# Patient Record
Sex: Male | Born: 1955 | Race: White | Hispanic: No | Marital: Married | Smoking: Never smoker
Health system: Southern US, Community
[De-identification: ages and names within clinical notes are randomized; demographics above are authoritative.]

## PROBLEM LIST (undated history)

## (undated) DIAGNOSIS — I10 Essential (primary) hypertension: Secondary | ICD-10-CM

## (undated) DIAGNOSIS — K4090 Unilateral inguinal hernia, without obstruction or gangrene, not specified as recurrent: Secondary | ICD-10-CM

## (undated) DIAGNOSIS — Z973 Presence of spectacles and contact lenses: Secondary | ICD-10-CM

---

## 2007-09-20 ENCOUNTER — Inpatient Hospital Stay (HOSPITAL_COMMUNITY): Admission: EM | Admit: 2007-09-20 | Discharge: 2007-09-24 | Payer: Self-pay | Admitting: Emergency Medicine

## 2007-09-20 ENCOUNTER — Encounter (INDEPENDENT_AMBULATORY_CARE_PROVIDER_SITE_OTHER): Payer: Self-pay | Admitting: Surgery

## 2007-09-21 ENCOUNTER — Encounter: Payer: Self-pay | Admitting: Gastroenterology

## 2007-09-27 ENCOUNTER — Ambulatory Visit: Payer: Self-pay | Admitting: Gastroenterology

## 2010-04-24 IMAGING — CR DG ERCP WO/W SPHINCTEROTOMY
13 series · 13 of 13 positions shown · non-contrast
Comparison: Operative cholangiogram obtained yesterday.

CLINICAL DATA: Choledocholithiasis..

ERCP with sphincterotomy:

[dr (1 of 13)]
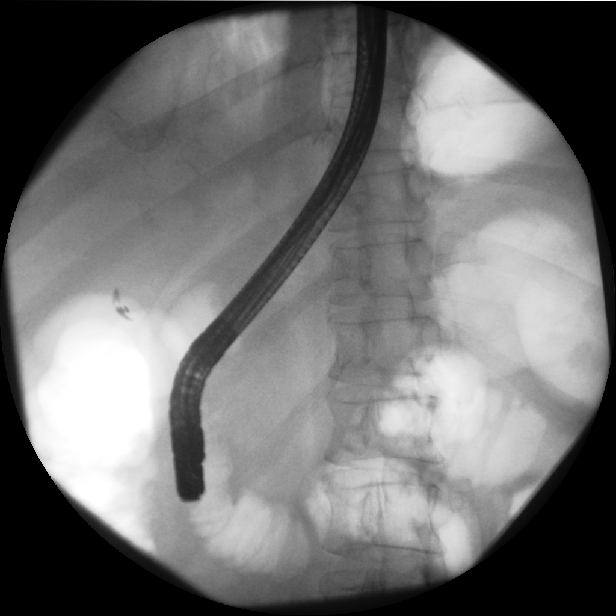

[dr (2 of 13)]
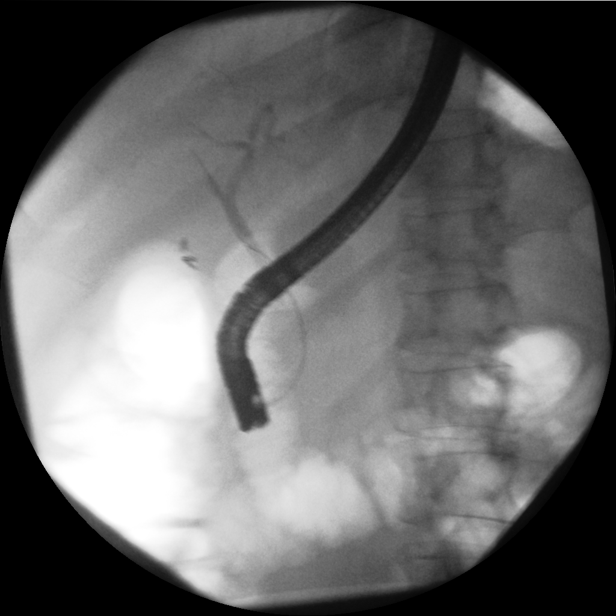

[dr (3 of 13)]
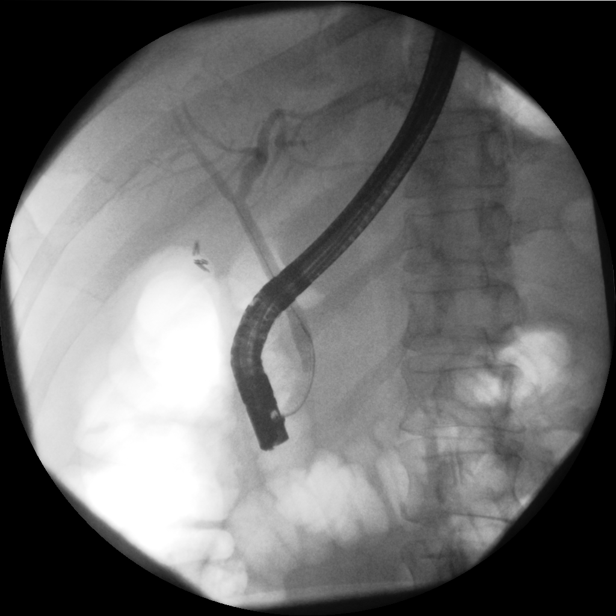

[dr (4 of 13)]
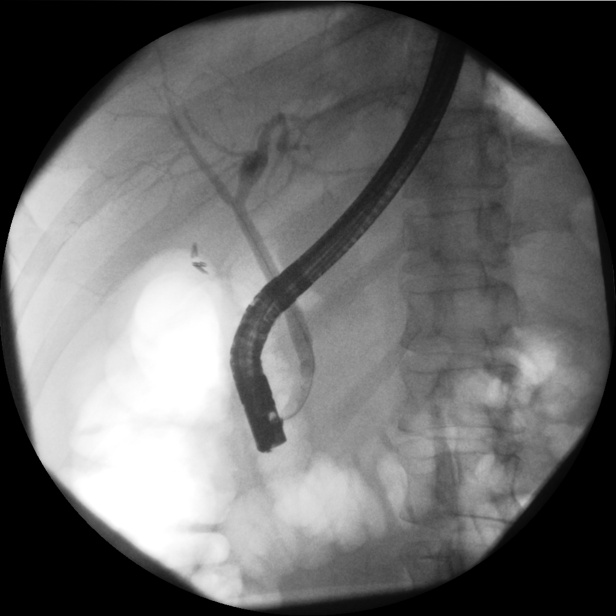

[dr (5 of 13)]
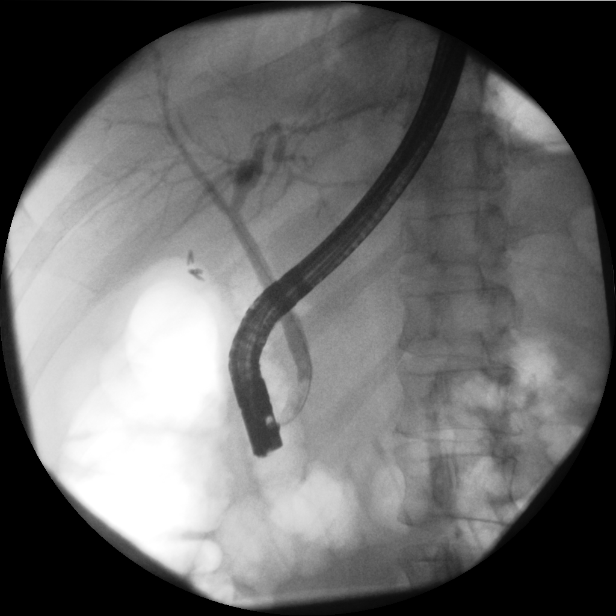

[dr (6 of 13)]
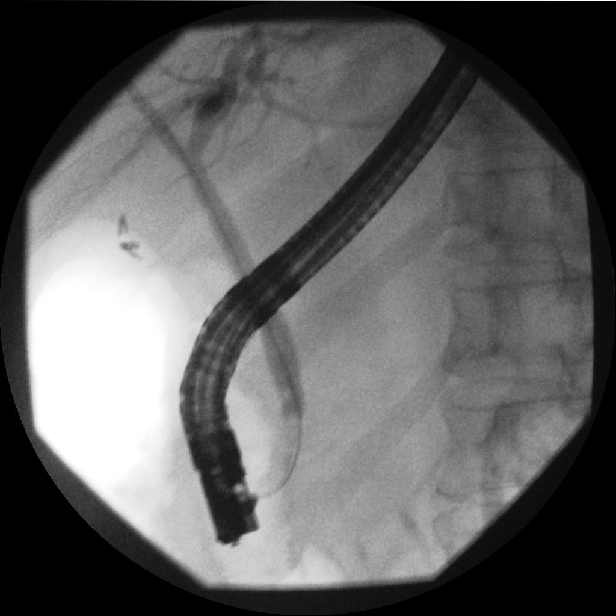

[dr (7 of 13)]
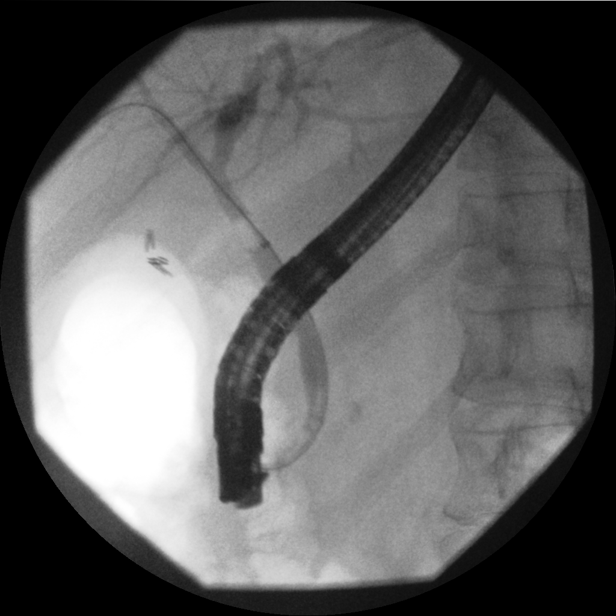

[dr (8 of 13)]
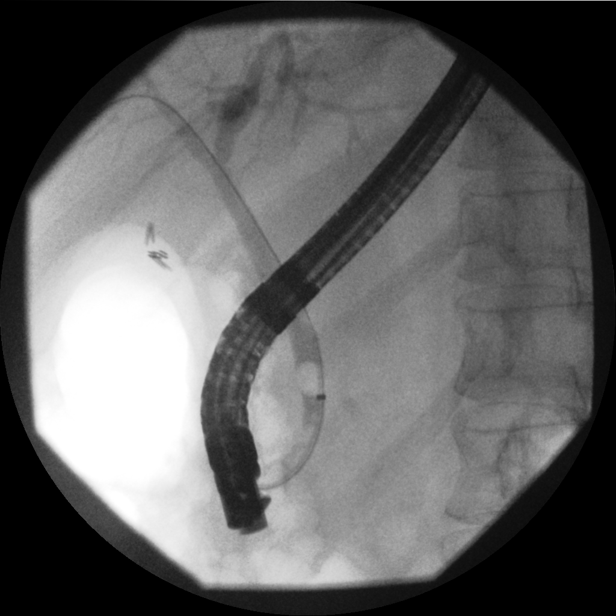

[dr (9 of 13)]
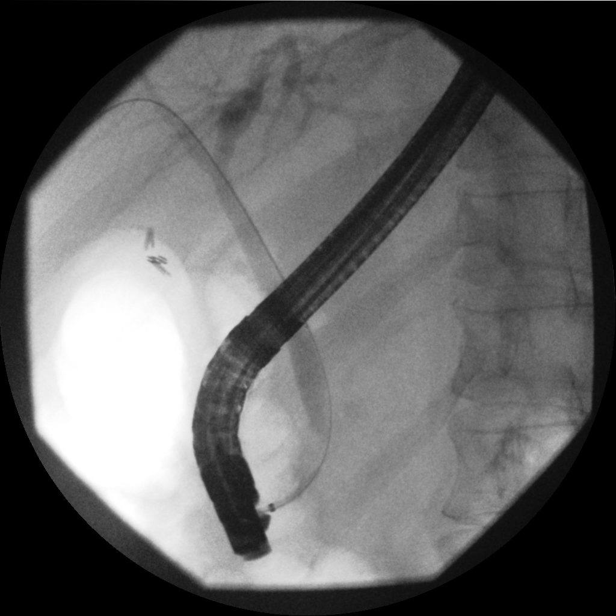

[dr (10 of 13)]
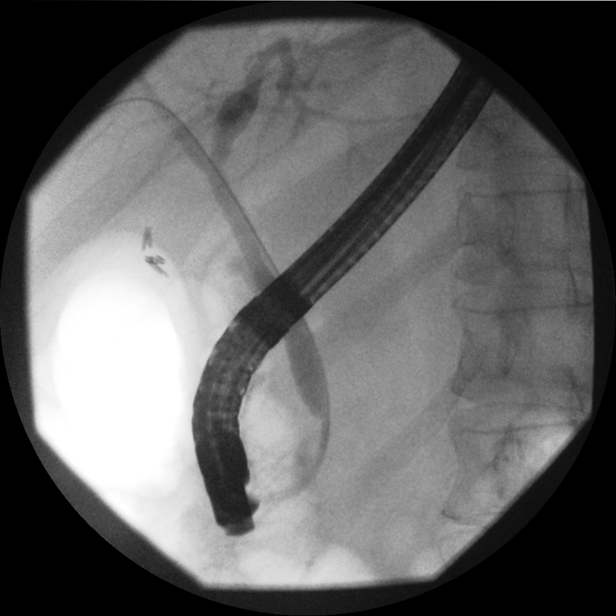

[dr (11 of 13)]
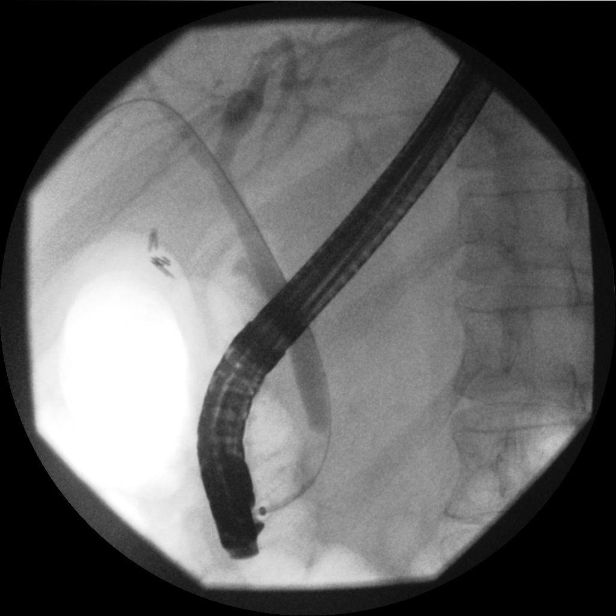

[dr (12 of 13)]
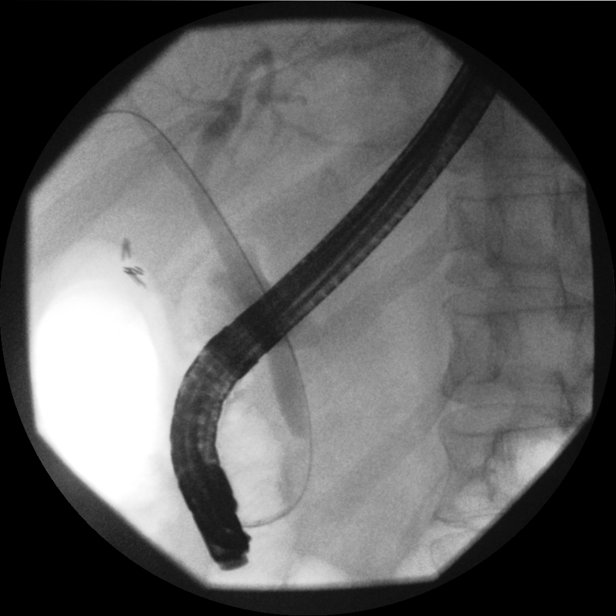

[dr (13 of 13)]
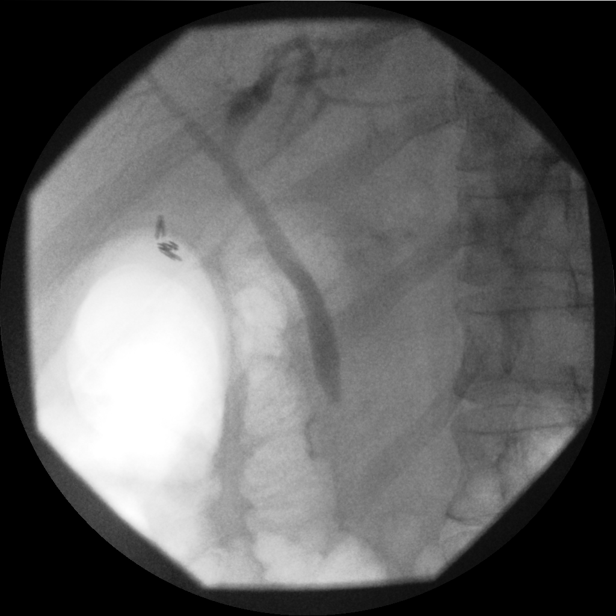

[13 of 13 positions shown; findings below may reference images not displayed]

FINDINGS: Multiple spot images of the right upper quadrant of the
abdomen demonstrate an endoscope in place with retrograde injection
of the common duct and intrahepatic ducts.  The cystic duct remnant
is also visualized.  On an image containing an inflated balloon in
the common duct, multiple filling defects are seen in the distal
common duct.  Multiple stones were reportedly removed during the
procedure, following sphincterotomy.  The last image demonstrates
removal of the scope with no residual filling defects seen.  No
flow of contrast into the duodenum is demonstrated.
IMPRESSION: Sphincterotomy and stone removal.

## 2010-10-12 NOTE — H&P (Signed)
Colin Powell, Colin Powell NO.:  [id number]   MEDICAL RECORD NO.:  [id number]          PATIENT TYPE:  INP   LOCATION:  0098                         FACILITY:  [facility]   PHYSICIAN:  Wilmon Arms. Corliss Skains, M.D. DATE OF BIRTH:  27-Dec-1955   DATE OF ADMISSION:  09/20/2007  DATE OF DISCHARGE:                              HISTORY & PHYSICAL   CHIEF COMPLAINT:  Right upper quadrant abdominal pain, nausea, and  vomiting.   PRIMARY CARE DOCTOR:  Dr. Benedetto Goad.   HISTORY OF PRESENT ILLNESS:  The patient is a 55 year old male with a  known recent history of biliary colic who presents with a 24-hour  episode of severe right upper quadrant pain.  The pain is spread across  his abdomen and is actually lower in his abdomen now.  He has had some  nausea and vomiting.  He has not had any bowel movements in the last 24  hours.  He presented to Dr. Tawana Scale office who sent him to the [facility] emergency department.  We were consulted to see the patient.   PAST MEDICAL HISTORY:  None.   PAST SURGICAL HISTORY:  None.   FAMILY HISTORY:  Noncontributory.   SOCIAL HISTORY:  Nonsmoker.  Occasional alcohol use.   ALLERGIES:  NONE.   MEDICATIONS:  None.   REVIEW OF SYSTEMS:  Otherwise unremarkable.   PHYSICAL EXAMINATION:  Vital Signs:  Temperature 98.4, pulse 101,  respirations 18, blood pressure 149/96.  General:  This is a well-  developed, well-nourished male in no apparent distress.  HEENT:  EOMI.  Sclerae are anicteric.  Neck:  No mass, no thyromegaly.  Lungs: Clear.  Normal respiratory effort.  Heart: Regular rate and rhythm.  No murmur.  Abdomen: Hypoactive bowel sounds, tender in the right upper quadrant as  well as across the midabdomen.  There is some mild radiation of pain  around to the right side of his back.  Extremities: No edema.  Well  perfused.  SKIN:  Warm and dry with no sign of jaundice.   RADIOLOGIC STUDIES:  The patient has an ultrasound from 07/20/2007  which  showed cholelithiasis with sludge but no sign of wall thickening.   LABORATORY DATA:  Labs were pending at the initial time of admission,  but postoperatively, his white count was noted be elevated at 13.7 and  hemoglobin 17.6.  Electrolytes within normal limits.  Amylase 1255,  lipase 844.  Total bilirubin 822.  AST and ALT are also elevated.   IMPRESSION:  1. Acute cholecystitis.  2. Gallstone pancreatitis.  3. Choledocholithiasis.   PLAN:  Laparoscopic cholecystectomy with intraoperative cholangiogram.  If we are unable clear the common bile duct, then he will need an ERCP.      Wilmon Arms. Tsuei, M.D.  Electronically Signed     MKT/MEDQ  D:  09/20/2007  T:  09/20/2007  Job:  [id number]

## 2010-10-12 NOTE — Op Note (Signed)
NAMEAVIRAJ, Colin Powell Colin Powell.:  [id number]   MEDICAL RECORD Colin Powell.:  [id number]          PATIENT TYPE:  INP   LOCATION:  0098                         FACILITY:  [facility]   PHYSICIAN:  Wilmon Arms. Corliss Skains, M.D. DATE OF BIRTH:  May 30, 1956   DATE OF PROCEDURE:  09/20/2007  DATE OF DISCHARGE:                               OPERATIVE REPORT   PREOPERATIVE DIAGNOSIS:  Acute cholecystitis.   POSTOPERATIVE DIAGNOSIS:  Acute cholecystitis, choledocholithiasis,  gallstone pancreatitis.   PROCEDURE PERFORMED:  Laparoscopic cholecystectomy with intraoperative  cholangiogram.   SURGEON:  Wilmon Arms. Tsuei, M.D., FACS   ANESTHESIA:  General endotracheal.   INDICATIONS:  The patient is a 55 year old male who presents with 24  hours of acute right upper quadrant pain with nausea and vomiting.  This  pain is radiating around to his back and has begun to spread across the  lower part of his abdomen.  He has had Colin Powell appetite.  He has been  afebrile.  His white count was elevated.  His bilirubin was also  elevated to 2.2.  Prior to the time of surgery, his amylase and lipase  were not back from the lab.   DESCRIPTION OF PROCEDURE:  The patient was brought to the operating room  and placed in the supine position on operating room table.  After an  adequate level of general anesthesia was obtained, the patient's abdomen  was shaved, prepped with Betadine, and draped in a sterile fashion.  A  time out was taken to assure the proper patient and proper procedure.  The area below his umbilicus was infiltrated with 0.25% Marcaine with  epinephrine.  A transverse incision was made and dissection was carried  down to the fascia.  The fascia was grasped with Kocher clamps and  incised vertically.  The peritoneal cavity was bluntly entered.  A stay  suture of 0 Vicryl was placed around the fascial opening.  The Hasson  cannula was inserted and secured with a stay suture.  Pneumoperitoneum  was  obtained by insufflating CO2 and maintaining a maximum pressure of  15 mmHg.  The laparoscope was inserted and the patient was positioned in  reversed Trendelenburg and tilted to his left.  A very inflamed  edematous gallbladder was identified.  An 11 mm port was placed in the  subxiphoid position, two 5 mm ports were placed in the right upper  quadrant.  The gallbladder was grasped with a clamp and elevated.  Cautery was used to dissect a lot of adhesions away from the surface of  the gallbladder.  We were able to retract the gallbladder superiorly.  We used mostly blunt dissection to expose the wall of the gallbladder  near the hilum.  Cautery was used very sparingly.  We were able to  bluntly dissect around a tubular vessel entering the gallbladder.  This  did not appear to be a cystic duct.  This was ligated with clips and  divided.  Examination of the stump showed that this, indeed, had a lumen  and was the cystic artery.  We then bluntly dissected around the  cystic  duct.  The cystic duct seemed a bit dilated.  There were Colin Powell stones  palpated within the cystic duct, however.  Once we had cleared an  adequate length of the cystic duct, it was ligated and clipped distally.  An opening was created on the cystic duct.  We immediately encountered  multiple tiny stones and some sludge.  A Cook cholangiogram catheter was  then inserted through a stab incision threaded into the cystic duct.  It  was secured with a clip.  A cholangiogram was obtained which showed good  flow proximally and distally of the biliary tree.  However, contrast did  not flow into the duodenum.  There seemed to be some filling defects at  the distal common bile duct.  The catheter was then removed.  The cystic  duct was ligated with three clips and divided.  A posterior branch of  the cystic artery was also ligated and divided.  Cautery was then used  to remove the gallbladder from the liver bed.  The gallbladder was   placed in an EndoCatch sac.  We thoroughly cauterized the gallbladder  fossa for hemostasis.  The gallbladder was then removed from the  umbilical port site.  We suctioned out as much irrigation as possible.  The trocars were removed as the pneumoperitoneum was released.  The  pursestring suture was used to close the fascia.  4-0 Monocryl was used  to close the skin incisions.  Steri-Strips and clean dressings were  applied.  The patient was extubated and brought to recovery in stable  condition.  All sponge, instrument, and needle counts were correct.      Wilmon Arms. Tsuei, M.D.  Electronically Signed     MKT/MEDQ  D:  09/20/2007  T:  09/20/2007  Job:  [id number]

## 2010-10-15 NOTE — Discharge Summary (Signed)
NAMEDONNEY, CARAVEO NO.:  [id number]   MEDICAL RECORD NO.:  [id number]          PATIENT TYPE:  AMB   LOCATION:  DAY                          FACILITY:  [facility]   PHYSICIAN:  Wilmon Arms. Corliss Skains, M.D. DATE OF BIRTH:  02-11-1956   DATE OF ADMISSION:  09/20/2007  DATE OF DISCHARGE:  09/24/2007                               DISCHARGE SUMMARY   ADMISSION DIAGNOSES:  Acute cholecystitis.   DISCHARGE DIAGNOSIS:  1. Acute cholecystitis.  2. Choledocholithiasis.  3. Gallstone pancreatitis.   PROCEDURE PERFORMED:  1. Laparoscopic cholecystectomy with intraoperative cholangiogram  2. Endoscopic retrograde cholangiopancreatogram.   BRIEF HISTORY:  The patient is a 55 year old male in good health who had  been diagnosed with symptomatic cholelithiasis.  He was originally  scheduled for elective cholecystectomy but presented with 24 hours of  acute right upper quadrant pain associated with nausea and vomiting.  Presented to the emergency department and was noted to have an elevated  white count and mildly elevated bilirubin of 2.2.  He was brought to the  operating room where he underwent a laparoscopic cholecystectomy with  intraoperative cholangiogram.  His gallbladder was quite inflamed.  The  cholangiogram showed good flow proximally and distally in the biliary  tree but the contrast did not flow into the duodenum.  It appeared that  he was obstructed from the distal common bile duct stones  postoperatively.  The remainder the patient's lab work was finally  complete and he was noted to have some pancreatitis.  The patient was  admitted to hospital and was seen in consultation by gastroenterology.  He underwent an ERCP on 04/24 with a sphincterotomy.  The balloon was  used to removed three stones.  The post sphincterotomy cholangiogram  showed clear common bile duct.  The patient slowly recovered and his lab  values returns to normal.  He was discharged home on  postoperative day  #4.  At that time he had not had any nausea or pain.  He was tolerating  a regular diet.   LABORATORY DATA:  Total bilirubin level was 1.3, amylase and lipase were  normal, AST was 21 and ALT was 112.   DISCHARGE INSTRUCTIONS:  1. The patient is given p.r.n. Percocet.  2. Follow up in 2-3 weeks with Dr. Corliss Skains.  3. The patient may shower.  4. He should take a low-fat diet.     Wilmon Arms. Tsuei, M.D.  Electronically Signed    MKT/MEDQ  D:  10/10/2007  T:  10/10/2007  Job:  [id number]

## 2011-02-22 LAB — CBC
HCT: 34.4 — ABNORMAL LOW
HCT: 52.4 — ABNORMAL HIGH
Hemoglobin: 12 — ABNORMAL LOW
Hemoglobin: 17.6 — ABNORMAL HIGH
MCHC: 33.5
MCHC: 34.5
MCV: 97
RBC: 4.27
RDW: 13.6
WBC: 10.2
WBC: 12.2 — ABNORMAL HIGH

## 2011-02-22 LAB — COMPREHENSIVE METABOLIC PANEL
ALT: 536 — ABNORMAL HIGH
AST: 332 — ABNORMAL HIGH
Alkaline Phosphatase: 101
Alkaline Phosphatase: 64
BUN: 14
BUN: 5 — ABNORMAL LOW
CO2: 28
Calcium: 8.2 — ABNORMAL LOW
Calcium: 9.3
Chloride: 101
Creatinine, Ser: 0.99
GFR calc Af Amer: >60
GFR calc non Af Amer: >60
Glucose, Bld: 107 — ABNORMAL HIGH
Glucose, Bld: 156 — ABNORMAL HIGH
Potassium: 3.9
Potassium: 4.3
Sodium: 142
Total Bilirubin: 1.2
Total Protein: 7.2

## 2011-02-22 LAB — HEPATIC FUNCTION PANEL
AST: 81 — ABNORMAL HIGH
Albumin: 2.7 — ABNORMAL LOW
Alkaline Phosphatase: 76
Indirect Bilirubin: 1 — ABNORMAL HIGH
Total Protein: 5.4 — ABNORMAL LOW

## 2011-02-22 LAB — AMYLASE
Amylase: 1255 — ABNORMAL HIGH
Amylase: 192 — ABNORMAL HIGH
Amylase: 600 — ABNORMAL HIGH
Amylase: 68

## 2011-02-22 LAB — APTT: aPTT: 28

## 2011-02-22 LAB — LIPASE, BLOOD
Lipase: 23
Lipase: 844 — ABNORMAL HIGH

## 2016-01-06 DIAGNOSIS — D229 Melanocytic nevi, unspecified: Secondary | ICD-10-CM | POA: Insufficient documentation

## 2016-01-06 DIAGNOSIS — F5221 Male erectile disorder: Secondary | ICD-10-CM | POA: Insufficient documentation

## 2017-08-16 DIAGNOSIS — Z1211 Encounter for screening for malignant neoplasm of colon: Secondary | ICD-10-CM | POA: Insufficient documentation

## 2018-06-05 ENCOUNTER — Ambulatory Visit: Payer: Self-pay | Admitting: Surgery

## 2018-06-05 NOTE — H&P (Signed)
Colin Powell Documented: 06/04/2018 4:46 PM Location: [facility] Patient #: [id number] DOB: May 25, 1956 Married / Language: Colin Powell / Race: White Male  History of Present Illness Colin Hector MD; 06/05/2018 8:56 AM) The patient is a 63 year old male who presents with a colorectal polyp. Note for "Colorectal polyp": ` ` ` Patient sent for surgical consultation at the request of Dr. [facility]  Chief Complaint: Abnormal lesion in rectum seen on screening colonoscopy  The patient is a pleasant male who underwent a 10 year follow-up colonoscopy June 2019. Normal except for a mass in the distal rectum noted. Suspected by his gastroenterologist, Dr. Paulita Fujita, to most likely be benign. However, surgical consultation recommended just in case. My examination a few weeks after the colonoscopy seem more consistent with a benign anal crypt polyp or atypical hemorrhoid. Recommended 6 month follow-up. He comes in without any symptoms or complaints are itching or irritation.  Patient usually has a loose bowel movement about every other day. Had a cholecystectomy about a decade ago. He's not had any anorectal issues and he knows of. Had a normal colonoscopy 10 years ago. Not a diabetic. Does not smoke. Walks a couple miles a day. No cardiac or pulmonary issues. 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 No history of condyloma or warts.  No personal nor family history of GI/colon cancer, inflammatory bowel disease, irritable bowel syndrome, allergy such as Celiac Sprue, dietary/dairy problems, colitis, ulcers nor gastritis. No recent sick contacts/gastroenteritis. No travel outside the country. No changes in diet. No dysphagia to solids or liquids. No significant heartburn or reflux. No hematochezia, hematemesis, coffee ground emesis. No evidence of prior gastric/peptic ulceration.  (Review of systems as stated in this history (HPI) or in the review  of systems. 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 Caffeine use Coffee. No drug use Tobacco use Never smoker.  Family History Colin Hector, MD; 06/04/2018 4:56 PM) Heart Disease Father, Mother. Hypertension Father.  Other Problems Colin Hector, MD; 06/04/2018 4:56 PM) Cholelithiasis    Vitals ([name] CMA; 06/04/2018 4:46 PM) 06/04/2018 4:46 PM Weight: 186 lb Height: 70in Body Surface Area: 2.02 m Body Mass Index: 26.69 kg/m  Pulse: 83 (Regular)  BP: 130/82 (Sitting, Left Arm, Standard)      Physical Exam Colin Hector MD; 06/04/2018 5:17 PM)  General Mental Status-Alert. General Appearance-Not in acute distress, Not Sickly. Orientation-Oriented X3. Hydration-Well hydrated. Voice-Normal.  Integumentary Global Assessment Normal Exam - Axillae: non-tender, no inflammation or ulceration, no drainage. and Distribution of scalp and body hair is normal. General Characteristics Temperature - normal warmth is noted.  Head and Neck Head-normocephalic, atraumatic with no lesions or palpable masses. Face Global Assessment - atraumatic, no absence of expression. Neck Global Assessment - no abnormal movements, no bruit auscultated  on the right, no bruit auscultated on  the left, no decreased range of motion, non-tender. Trachea-midline. Thyroid Gland Characteristics - non-tender.  Eye Eyeball - Left-Extraocular movements intact, No Nystagmus. Eyeball - Right-Extraocular movements intact, No Nystagmus. Cornea - Left-No Hazy. Cornea - Right-No Hazy. Sclera/Conjunctiva - Left-No scleral icterus, No Discharge. Sclera/Conjunctiva - Right-No scleral icterus, No Discharge. Pupil - Left-Direct reaction to light normal. Pupil - Right-Direct reaction to light normal.  ENMT Ears Pinna - Left - no drainage observed, no generalized tenderness observed. Right - no drainage observed, no generalized tenderness observed. Nose and Sinuses Nose - no destructive lesion observed. Nares - Left - quiet respiration. Right - quiet respiration. Mouth and Throat Lips - Upper Lip - no fissures observed, no pallor noted. Lower Lip - no fissures observed, no pallor noted. Nasopharynx - no discharge present. Oral Cavity/Oropharynx - Tongue - no dryness observed. Oral Mucosa - no cyanosis observed. Hypopharynx - no evidence of airway distress observed.  Chest and Lung Exam Inspection Movements - Normal and Symmetrical. Accessory muscles - No use of accessory muscles in breathing. Palpation Normal exam - Non-tender. Auscultation Breath sounds - Normal and Clear.  Cardiovascular Auscultation Rhythm - Regular. Murmurs & Other Heart Sounds - Normal exam - No Murmurs and No Systolic Clicks.  Abdomen Inspection Normal Exam - No Visible peristalsis and No Abnormal pulsations. Umbilicus - No Bleeding, No Urine drainage. Palpation/Percussion Normal exam - Soft, Non Tender, No Rebound tenderness, No Rigidity (guarding) and No Cutaneous hyperesthesia. Note: Abdomen soft. Mild diastases recti. Small 4 mm umbilical hernia at stalk asymptomatic. Nontender. Not distended. No other obvious abdominal wall hernias. No  guarding.  Male Genitourinary Sexual Maturity Tanner 5 - Adult hair pattern and Adult penile size and shape. Note: Obvious left inguinal hernia more prominent with cough. No obvious right inguinal hernia but mild impulse suspected with Valsalva. Normal external genitalia. Epididymi, testes, and spermatic cords normal without any masses.  Rectal Note: Please refer to anoscopy section. Right anterior pedunculated mass at anal crypt consistent with hemorrhoid/benign anal canal polyp. Smooth. Not ulcerated. Not fibrotic. Grade 2 internal hemorrhoids.  Peripheral Vascular Upper Extremity Inspection - Left - No Cyanotic nailbeds, Not Ischemic. Right - No Cyanotic nailbeds, Not Ischemic.  Neurologic Neurologic evaluation reveals -normal attention span and ability to concentrate, able to name objects and repeat phrases. Appropriate fund of knowledge , normal sensation and normal coordination. Mental Status Affect - not angry, not paranoid. Cranial Nerves-Normal Bilaterally. Gait-Normal.  Neuropsychiatric Mental status exam performed with findings of-able to articulate well with normal speech/language, rate, volume and coherence, thought content normal with ability to perform basic computations and apply abstract reasoning and no evidence of hallucinations, delusions, obsessions or homicidal/suicidal ideation.  Musculoskeletal Global Assessment Spine, Ribs and Pelvis - no instability, subluxation or laxity. Right Upper Extremity - no instability, subluxation or laxity.  Lymphatic Head & Neck General Head & Neck Lymphatics: Bilateral - Description - No Localized lymphadenopathy. Axillary General Axillary Region: Bilateral - Description - No Localized lymphadenopathy. Femoral & Inguinal Generalized Femoral & Inguinal Lymphatics: Left: Right - Description - No Localized lymphadenopathy. Description - No Localized lymphadenopathy.   Results Colin Hector MD; 06/05/2018 8:55  AM) Procedures  Name Value Date Hemorrhoids Procedure Other: Again noted in the right anterior aspect is a enlarged anal crypt polyp. 15x4x22mm. Very smooth tip without any ulceration. Pink with no color changes. Essentially unchanged............Marland KitchenSome mild skin changes stable and unchanged externally. No external hemorrhoids. No abscess or fissure. Normal sphincter tone. No other rectal masses felt or seen  to 7 cm by digital exam and anoscopy  Performed: 06/04/2018 5:04 PM    Assessment & Plan Colin Hector MD; 06/05/2018 8:55 AM)  LEFT INGUINAL HERNIA (K40.90) Impression: More obvious left inguinal hernia. I suspect given how relatively young and active he is, he would benefit from surgery to repair this. We do outpatient elective laparoscopic repair. Check the contralateral side to make sure he is not developing hernia there as well. He was open to the idea but I don't think he is ready make a decision about this. He'll let us know  Current Plans Written instructions provided The anatomy & physiology of the abdominal wall and pelvic floor was discussed. The pathophysiology of hernias in the inguinal and pelvic region was discussed. Natural history risks such as progressive enlargement, pain, incarceration, and strangulation was discussed. Contributors to complications such as smoking, obesity, diabetes, prior surgery, etc were discussed.  I feel the risks of no intervention will lead to serious problems that outweigh the operative risks; therefore, I recommended surgery to reduce and repair the hernia. I explained laparoscopic techniques with possible need for an open approach. I noted usual use of mesh to patch and/or buttress hernia repair  Risks such as bleeding, infection, abscess, need for further treatment, heart attack, death, and other risks were discussed. I noted a good likelihood this will help address the problem. Goals of post-operative recovery were  discussed as well. Possibility that this will not correct all symptoms was explained. I stressed the importance of low-impact activity, aggressive pain control, avoiding constipation, & not pushing through pain to minimize risk of post-operative chronic pain or injury. Possibility of reherniation was discussed. We will work to minimize complications.  An educational handout further explaining the pathology & treatment options was given as well. Questions were answered. The patient expresses understanding & wishes to proceed with surgery.   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 Not super high suspicion. Stable for the past 6 months and asymptomatic. Reassuring against any major concern. I think he can follow-up as needed. Should he have symptoms of bleeding or pruritus or irritation, can reconsider removal. He feels reassured  Current Plans ANOSCOPY, DIAGNOSTIC (15176) Return to clinic as needed.  Soreness, decreased appetite, and poor energy level are common problems after surgery. While many people can struggle with a bad day, these concerns should gradually fade away or at least improve. Much of your recovery depends on your health & the severity of your operation. Please call if you have any further questions / concerns related to surgery.  Increase activity as tolerated to regular everyday activity. Consider daily low impact exercise every day such as walking an hour a day.  Do not push through pain. If it hurts to do it, then don't do it.  Diet as tolerated. Low fat high fiber diet ideal. 30 g fiber a day ideal. Consider taking a daily fiber supplement to keep your bowels regular.  Followup with your primary care physician for other health issues as would normally be done.  Consider screening for malignancies (breast, prostate, colon, melanoma, etc) as appropriate. Discuss with  you primary care physician.   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 Not severe. Consider adding fiber to help thicken up his stools but his bowel movements are not highly irregular. He feels reassured.   PREOP - ING HERNIA - ENCOUNTER FOR PREOPERATIVE EXAMINATION FOR GENERAL SURGICAL  PROCEDURE (Z01.818)  Current Plans You are being scheduled for surgery- Our schedulers will call you.  You should hear from our office's scheduling department within 5 working days about the location, date, and time of surgery. We try to make accommodations for patient's preferences in scheduling surgery, but sometimes the OR schedule or the surgeon's schedule prevents [place] from making those accommodations.  If you have not heard from our office [phone]) in 5 working days, call the office and ask for your surgeon's nurse.  If you have other questions about your diagnosis, plan, or surgery, call the office and ask for your surgeon's nurse.  Pt Education - Pamphlet Given - Laparoscopic Hernia Repair: discussed with patient and provided information. Pt Education - [facility] Hernia Post-Op HCI ([employer]): discussed with patient and provided information. Pt Education - [facility] Mesh education: discussed with patient and provided information.  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 Some mild intermittent flares a couple times a year usually treated with Valtrex. No perianal complaints.  Colin Hector, MD, FACS, MASCRS [facility]astro[facility]ally [facility] Surgery    [address], [place], [place] [address] [phone] Main / Paging [phone] Fax

## 2019-03-31 DIAGNOSIS — U071 COVID-19: Secondary | ICD-10-CM

## 2019-03-31 HISTORY — DX: COVID-19: U07.1

## 2019-11-05 DIAGNOSIS — B009 Herpesviral infection, unspecified: Secondary | ICD-10-CM | POA: Insufficient documentation

## 2019-11-05 DIAGNOSIS — K4020 Bilateral inguinal hernia, without obstruction or gangrene, not specified as recurrent: Secondary | ICD-10-CM | POA: Diagnosis present

## 2019-11-05 DIAGNOSIS — I10 Essential (primary) hypertension: Secondary | ICD-10-CM | POA: Insufficient documentation

## 2019-12-31 DIAGNOSIS — L03031 Cellulitis of right toe: Secondary | ICD-10-CM | POA: Insufficient documentation

## 2020-01-21 ENCOUNTER — Ambulatory Visit: Payer: Self-pay | Admitting: Surgery

## 2020-01-21 NOTE — H&P (Signed)
Phillips Colin Powell Documented: 08/20/2019 9:58 AM Location: [facility] Patient #: [id number] DOB: 1956/03/08 Married / Language: Cleophus Molt / Race: White Male   History of Present Illness [name] MD; 08/20/2019 12:46 PM) The patient is a 64 year old male who presents with a colorectal polyp. Note for "Colorectal polyp": ` ` ` Patient sent for surgical consultation at the request of Dr. [facility]  Chief Complaint: Abnormal lesion in rectum seen on screening colonoscopy   Patient returns from last year. Had a pedunculated anal canal mass consistent with an anal crypt hypertrophic polyp. Seemed benign. No change between colonoscopy 2019 & my office anoscopy 2020. Tried to reassure him. He was asymptomatic and did not want to have it removed. He also had some vague groin discomfort and noted he had an inguinal hernia. Offered hernia repair. He wished to hold off.  Since that time he noted he had some anal "twinges". Persistent for the past few months. Does not notice any obvious bleeding. No leaking of stool. However some discomfort or fullness like he hasn't fully emptied after defecation. Usually moves his bowels once or twice a day. Occasionally they are loose but nothing too severe. He is convinced it's been an issue since his cholecystectomy 2019. No major swings of diarrhea or constipation. He also notes that he felt sharp groin pain that made him feel like he was "kicked in the knots". It persisted for several weeks. Less sensitive now.  Based on these symptoms, he wished to return to double check nothing was going on that was of concern. No problems with urination or urinary tract infections. No recent fall or trauma. No other surgeries. No new health issues.  This patient encounter took 35 minutes today to perform the following: obtain history, perform exam, review outside records, interpret tests & imaging, counsel the patient  on their diagnosis; and, document this encounter, including findings & plan in the electronic health record (EHR).     PRIOR NOTE: The patient is a pleasant male who underwent a 10 year follow-up colonoscopy June 2019. Normal except for a mass in the distal rectum noted. Suspected by his gastroenterologist, Dr. Paulita Fujita, to most likely be benign. However, surgical consultation recommended just in case. My examination a few weeks after the colonoscopy seem more consistent with a benign anal crypt polyp or atypical hemorrhoid. Recommended 6 month follow-up. He comes in without any symptoms or complaints are itching or irritation.  Patient usually has a loose bowel movement about every other day. Had a cholecystectomy about a decade ago. He's not had any anorectal issues and he knows of. Had a normal colonoscopy 10 years ago. Not a diabetic. Does not smoke. Walks a couple miles a day. No cardiac or pulmonary issues. 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 No history of condyloma or warts.  No personal nor family history of GI/colon cancer, inflammatory bowel disease, irritable bowel syndrome, allergy such as Celiac Sprue, dietary/dairy problems, colitis, ulcers nor gastritis. No recent sick contacts/gastroenteritis. No travel outside the country. No changes in diet. No dysphagia to solids or liquids. No significant heartburn or reflux. No hematochezia, hematemesis, coffee ground emesis. No evidence of prior gastric/peptic ulceration.  (Review of systems as stated in this history (HPI) or in the review of systems. Otherwise all other 12 point ROS are negative) ` ` `   Past Surgical History [name], MD; 08/20/2019 10:06 AM) Gallbladder Surgery - Laparoscopic  Allergies ([name], CMA; 08/20/2019 10:00 AM) No Known Drug Allergies  [11/22/2017]: Allergies Reconciled   Medication History ([name], CMA; 08/20/2019 10:00 AM) Losartan Potassium  (50MG  Tablet, Oral) Active. Medications Reconciled    Review of Systems ([name] CMA; 08/20/2019 9:59 AM) General Not Present- Appetite Loss, Chills, Fatigue, Fever, Night Sweats, Weight Gain and Weight Loss. Skin Not Present- Change in Wart/Mole, Dryness, Hives, Jaundice, New Lesions, Non-Healing Wounds, Rash and Ulcer. HEENT Not Present- Earache, Hearing Loss, Hoarseness, Nose Bleed, Oral Ulcers, Ringing in the Ears, Seasonal Allergies, Sinus Pain, Sore Throat, Visual Disturbances, Wears glasses/contact lenses and Yellow Eyes. Respiratory Not Present- Bloody sputum, Chronic Cough, Difficulty Breathing, Snoring and Wheezing. Breast Not Present- Breast Mass, Breast Pain, Nipple Discharge and Skin Changes. Cardiovascular Not Present- Chest Pain, Difficulty Breathing Lying Down, Leg Cramps, Palpitations, Rapid Heart Rate, Shortness of Breath and Swelling of Extremities. Gastrointestinal Not Present- Abdominal Pain, Bloating, Bloody Stool, Change in Bowel Habits, Chronic diarrhea, Constipation, Difficulty Swallowing, Excessive gas, Gets full quickly at meals, Hemorrhoids, Indigestion, Nausea, Rectal Pain and Vomiting. Male Genitourinary Not Present- Blood in Urine, Change in Urinary Stream, Frequency, Impotence, Nocturia, Painful Urination, Urgency and Urine Leakage. Musculoskeletal Not Present- Back Pain, Joint Pain, Joint Stiffness, Muscle Pain, Muscle Weakness and Swelling of Extremities. Neurological Not Present- Decreased Memory, Fainting, Headaches, Numbness, Seizures, Tingling, Tremor, Trouble walking and Weakness. Psychiatric Not Present- Anxiety, Bipolar, Change in Sleep Pattern, Depression, Fearful and Frequent crying. Endocrine Not Present- Cold Intolerance, Excessive Hunger, Hair Changes, Heat Intolerance, Hot flashes and New Diabetes. Hematology Not Present- Blood Thinners, Easy Bruising, Excessive bleeding, Gland problems, HIV and Persistent Infections.  Vitals ([name]  CMA; 08/20/2019 10:01 AM) 08/20/2019 10:01 AM Weight: 180.2 lb Height: 70in Body Surface Area: 2 m Body Mass Index: 25.86 kg/m  Temp.: 78F (Oral)  Pulse: 95 (Regular)  BP: 132/86(Sitting, Left Arm, Standard)       Physical Exam [name], MD; 08/20/2019 10:7 AM) General Mental Status-Alert. General Appearance-Not in acute distress, Not Sickly. Orientation-Oriented X3. Hydration-Well hydrated. Voice-Normal.  Integumentary Global Assessment Upon inspection and palpation of skin surfaces of the - Axillae: non-tender, no inflammation or ulceration, no drainage. and Distribution of scalp and body hair is normal. General Characteristics Temperature - normal warmth is noted.  Head and Neck Head-normocephalic, atraumatic with no lesions or palpable masses. Face Global Assessment - atraumatic, no absence of expression. Neck Global Assessment - no abnormal movements, no bruit auscultated on the right, no bruit auscultated on the left, no decreased range of motion, non-tender. Trachea-midline. Thyroid Gland Characteristics - non-tender.  Eye Eyeball - Left-Extraocular movements intact, No Nystagmus - Left. Eyeball - Right-Extraocular movements intact, No Nystagmus - Right. Cornea - Left-No Hazy - Left. Cornea - Right-No Hazy - Right. Sclera/Conjunctiva - Left-No scleral icterus, No Discharge - Left. Sclera/Conjunctiva - Right-No scleral icterus, No Discharge - Right. Pupil - Left-Direct reaction to light normal. Pupil - Right-Direct reaction to light normal.  ENMT Ears Pinna - Left - no drainage observed, no generalized tenderness observed. Pinna - Right - no drainage observed, no generalized tenderness observed. Nose and Sinuses External Inspection of the Nose - no destructive lesion observed. Inspection of the nares - Left - quiet respiration. Inspection of the nares - Right - quiet respiration. Mouth and Throat Lips -  Upper Lip - no fissures observed, no pallor noted. Lower Lip - no fissures observed, no pallor noted. Nasopharynx - no discharge present. Oral Cavity/Oropharynx - Tongue - no dryness observed. Oral  Mucosa - no cyanosis observed. Hypopharynx - no evidence of airway distress observed.  Chest and Lung Exam Inspection Movements - Normal and Symmetrical. Accessory muscles - No use of accessory muscles in breathing. Palpation Palpation of the chest reveals - Non-tender. Auscultation Breath sounds - Normal and Clear.  Cardiovascular Auscultation Rhythm - Regular. Murmurs & Other Heart Sounds - Auscultation of the heart reveals - No Murmurs and No Systolic Clicks.  Abdomen Inspection Inspection of the abdomen reveals - No Visible peristalsis and No Abnormal pulsations. Umbilicus - No Bleeding, No Urine drainage. Palpation/Percussion Palpation and Percussion of the abdomen reveal - Soft, Non Tender, No Rebound tenderness, No Rigidity (guarding) and No Cutaneous hyperesthesia. Note: Abdomen soft. Mild diastases recti. Small 4 mm umbilical hernia at stalk asymptomatic. Nontender. Not distended. No other obvious abdominal wall hernias. No guarding.   Male Genitourinary Sexual Maturity Tanner 5 - Adult hair pattern and Adult penile size and shape. Note: Obvious left inguinal hernia more prominent with cough. No obvious right inguinal hernia but mild impulse suspected with Valsalva. Normal external genitalia. Epididymi, testes, and spermatic cords normal without any masses.   Rectal Note: Please refer to anoscopy section. Right anterior pedunculated mass at anal crypt consistent with hemorrhoid/benign anal canal polyp. Smooth. Not ulcerated. Not fibrotic. Grade 2 internal hemorrhoids.   Peripheral Vascular Upper Extremity Inspection - Left - No Cyanotic nailbeds - Left, Not Ischemic. Inspection - Right - No Cyanotic nailbeds - Right, Not Ischemic.  Neurologic Neurologic  evaluation reveals -normal attention span and ability to concentrate, able to name objects and repeat phrases. Appropriate fund of knowledge , normal sensation and normal coordination. Mental Status Affect - not angry, not paranoid. Cranial Nerves-Normal Bilaterally. Gait-Normal.  Neuropsychiatric Mental status exam performed with findings of-able to articulate well with normal speech/language, rate, volume and coherence, thought content normal with ability to perform basic computations and apply abstract reasoning and no evidence of hallucinations, delusions, obsessions or homicidal/suicidal ideation.  Musculoskeletal Global Assessment Spine, Ribs and Pelvis - no instability, subluxation or laxity. Right Upper Extremity - no instability, subluxation or laxity.  Lymphatic Head & Neck General Head & Neck Lymphatics: Bilateral - Description - No Localized lymphadenopathy. Axillary General Axillary Region: Bilateral - Description - No Localized lymphadenopathy. Femoral & Inguinal Generalized Femoral & Inguinal Lymphatics: Left - Description - No Localized lymphadenopathy. Right - Description - No Localized lymphadenopathy.   Results [name] MD; 08/20/2019 10:37 AM) Procedures  Name Value Date Hemorrhoids Procedure Internal exam: Mass Other: Again noted in the right anterior aspect is a enlarged anal crypt polyp. 15x4x47mm. Very smooth tip without any ulceration. Pink with no color changes. It does prolapse down into the anal canal does not all the way out. Grade 2 left lateral and right posterior hemorrhoids. Not particularly inflamed. They do not prolapse out. The polyp might be slightly longer but otherwise unchanged............Marland KitchenSome mild hypopigmented dry skin changes stable and unchanged externally. Looks like history of chronic pruritus but nothing really active at this. No external hemorrhoids. No abscess or fissure. Normal sphincter tone. No  other rectal masses felt or seen to 7 cm by digital exam and anoscopy  Performed: 08/20/2019 10:17 AM    Assessment & Plan [name] MD; 08/20/2019 10:36 AM) ANAL POLYP (K62.0) Impression: Pedunculated mass at anal canal consistent with hypertrophic anal crypt non-adenomatous polyp. Perhaps a little bit longer and prolapsing into the anal canal but not all the way (some evidence old pruritus makes me wonder if it  does but he denies). Suspect that the source of his intermittent "anal twinges". Because of it having symptoms, I again offered removal. Outpatient surgery. That would allow me do some hemorrhoidal ligation/pexy as well. He is leaning towards removal, but he wants to wait though. He notes he is probably retiring later this year and wants to wait until after that if possible. I asked him to call us to let us know Current Plans You are being scheduled for surgery- Our schedulers will call you.  You should hear from our office's scheduling department within 5 working days about the location, date, and time of surgery. We try to make accommodations for patient's preferences in scheduling surgery, but sometimes the OR schedule or the surgeon's schedule prevents [place] from making those accommodations.  If you have not heard from our office [phone]) in 5 working days, call the office and ask for your surgeon's nurse.  If you have other questions about your diagnosis, plan, or surgery, call the office and ask for your surgeon's nurse.  Follow up if no improvement or if symptoms worsen ANOSCOPY, DIAGNOSTIC (46600) PROLAPSED INTERNAL HEMORRHOIDS, GRADE 2 (K64.1) Impression: Mildly inflamed internal hemorrhoids. Not severe. Again, consider adding fiber to help thicken up his stools but his bowel movements are not highly irregular. He feels reassured. Current Plans Pt Education - [facility] Hemorrhoids (Kerigan Narvaez): discussed with patient and provided information. PRURITUS ANI  (L29.0) Impression: Some thin changes and dryness suspicious for chronic pruritus in the past. I wonder if this polyp is intermittently prolapsing else causing itching and irritation. That is usually the most likely source, especially since he has no evidence of any fistula or any other potential source of chronic perineal/perianal moisture. No active pruritus at this time. We'll give him info to help him keep an eye on things Current Plans Pt Education - [facility] Pruritus Ani (AT) LEFT INGUINAL HERNIA (K40.90) Impression: More obvious left inguinal hernia. I suspect that is what made him feel "like having kicked in the nuts" for 2 weeks. Mild impulse on right side but certainly no major hernia.  Given how relatively young and active he is, he would benefit from surgery to repair this. I again offered outpatient elective laparoscopic repair. Check the contralateral side to make sure he is not developing hernia there as well. I noted I would not do this at the same time as the anal polyp removal. I do not want increased risks of infection. Most likely would do the anorectal surgery first and then this unless he becomes symptomatic. He again wants to wait until after he is retired and will let us know. Current Plans You are being scheduled for surgery- Our schedulers will call you.  You should hear from our office's scheduling department within 5 working days about the location, date, and time of surgery. We try to make accommodations for patient's preferences in scheduling surgery, but sometimes the OR schedule or the surgeon's schedule prevents [place] from making those accommodations.  If you have not heard from our office [phone]) in 5 working days, call the office and ask for your surgeon's nurse.  If you have other questions about your diagnosis, plan, or surgery, call the office and ask for your surgeon's nurse.  The anatomy & physiology of the abdominal wall and pelvic floor was discussed. The  pathophysiology of hernias in the inguinal and pelvic region was discussed. Natural history risks such as progressive enlargement, pain, incarceration, and strangulation was discussed. Contributors to complications such as smoking,  obesity, diabetes, prior surgery, etc were discussed.  I feel the risks of no intervention will lead to serious problems that outweigh the operative risks; therefore, I recommended surgery to reduce and repair the hernia. I explained laparoscopic techniques with possible need for an open approach. I noted usual use of mesh to patch and/or buttress hernia repair  Risks such as bleeding, infection, abscess, need for further treatment, heart attack, death, and other risks were discussed. I noted a good likelihood this will help address the problem. Goals of post-operative recovery were discussed as well. Possibility that this will not correct all symptoms was explained. I stressed the importance of low-impact activity, aggressive pain control, avoiding constipation, & not pushing through pain to minimize risk of post-operative chronic pain or injury. Possibility of reherniation was discussed. We will work to minimize complications.  An educational handout further explaining the pathology & treatment options was given as well. Questions were answered. The patient expresses understanding & wishes to proceed with surgery.    Signed electronically by [name], MD (08/20/2019 12:46 PM)   Home patient now ready to consider surgery  Prolapsing anal canal polyp with pruritus.  Symptomatic hemorrhoids   [name], MD, FACS, MASCRS Gastrointestinal and Minimally Invasive Surgery  [facility] [address] [place], [place] [address] [phone] Fax [phone] Main/Paging  CONTACT INFORMATION: Weekday (9AM-5PM) concerns: Call [facility] main office at [phone] Weeknight (5PM-9AM) or Weekend/Holiday concerns: Check  [email or web address] for General Surgery [facility] coverage (Please, do not use SecureChat as it is not reliable communication to operating surgeons for immediate patient care)

## 2020-02-19 ENCOUNTER — Ambulatory Visit: Payer: Medicare HMO | Admitting: Podiatry

## 2020-02-26 ENCOUNTER — Other Ambulatory Visit: Payer: Self-pay

## 2020-02-26 ENCOUNTER — Ambulatory Visit (INDEPENDENT_AMBULATORY_CARE_PROVIDER_SITE_OTHER): Payer: Medicare HMO | Admitting: Podiatry

## 2020-02-26 ENCOUNTER — Encounter: Payer: Self-pay | Admitting: Podiatry

## 2020-02-26 ENCOUNTER — Ambulatory Visit (INDEPENDENT_AMBULATORY_CARE_PROVIDER_SITE_OTHER): Payer: Medicare HMO

## 2020-02-26 DIAGNOSIS — D169 Benign neoplasm of bone and articular cartilage, unspecified: Secondary | ICD-10-CM | POA: Diagnosis not present

## 2020-02-26 DIAGNOSIS — M79671 Pain in right foot: Secondary | ICD-10-CM | POA: Diagnosis not present

## 2020-02-26 DIAGNOSIS — M2041 Other hammer toe(s) (acquired), right foot: Secondary | ICD-10-CM

## 2020-02-26 DIAGNOSIS — M79672 Pain in left foot: Secondary | ICD-10-CM

## 2020-02-26 NOTE — Progress Notes (Signed)
Subjective:   Patient ID: Colin Powell, male   DOB: 64 y.o.   MRN: [id number]   HPI Patient presents stating is had a lot of problems with the second toe on his right foot and has problems with the fifth toe of his left foot.  States that he was very active and it got red and it blistered and then the toe became swollen and he was on antibiotics which is improved the condition.  Patient does not smoke likes to be active   Review of Systems  All other systems reviewed and are negative.       Objective:  Physical Exam Vitals and nursing note reviewed.  Constitutional:      Appearance: He is well-developed.  Pulmonary:     Effort: Pulmonary effort is normal.  Musculoskeletal:        General: Normal range of motion.  Skin:    General: Skin is warm.  Neurological:     Mental Status: He is alert.     Neurovascular status is found to be intact muscle strength is found to be adequate range of motion adequate.  Patient has an elevated second digit right proximal joint with distal arthritis around the joint surface that is painful when pressed and has a keratotic lesion inside fifth digit left that is painful.  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  Reviewed her x-rays explaining to him the surgery that would be necessary and what it would require to fix this problem  X-rays indicate there is significant elevation second digit right and rotated fifth digit left with distal medial exostotic lesion

## 2020-04-09 ENCOUNTER — Other Ambulatory Visit: Payer: Self-pay | Admitting: Surgery

## 2020-06-03 ENCOUNTER — Ambulatory Visit: Payer: Self-pay | Admitting: Surgery

## 2020-06-03 NOTE — H&P (Signed)
Nicklaus P Anspaugh Appointment: 06/03/2020 1:45 PM Location: [facility] Patient #: [id number] DOB: 01/14/1956 Married / Language: English / Race: White Male  History of Present Illness (Grenda Lora C. Mckell Riecke MD; 06/03/2020 2:42 PM) The patient is a 65 year old male who presents with hemorrhoids. Note for "Hemorrhoids": ` ` ` The patient returns s/p hemorrhoidal ligation and pexy with excision of hemorrhoids and anal mass 04/09/2020      The patient returns to clinic after surgery, gradually improving. Denies any pain. Having regular bowel movements. No fevers or chills. Does feel like he is a little looser back there. Flatus sounds at a lower octave. However no incontinence.  Notes he still has his left groin hernia. Wondered if he could consider surgery to fix that as we discussed last year. No problems with urination or defecation. Staying on a fiber supplement. No fevers or chills.   ###########################################  Pathology: Hemorrhoids 3. 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 Maury Bamba, MD; 06/03/2020 2:24 PM) Gallbladder Surgery - Laparoscopic [09/20/2007]: Lap chole w IOC, Dr Tsuei  Diagnostic Studies History (Tayvian Holycross C. Laszlo Ellerby, MD; 06/03/2020 2:24 PM) Colonoscopy within last year  Allergies (Chanel Nolan, CMA; 06/03/2020 2:02 PM) No Known Drug Allergies [11/22/2017]: Allergies Reconciled  Medication History (Chanel Nolan, CMA; 06/03/2020 2:02 PM) Losartan Potassium (50MG Tablet,  Oral) Active. Medications Reconciled  Social History (Boss Danielsen C. Coley Kulikowski, MD; 06/03/2020 2:24 PM) Alcohol use Moderate alcohol use. Caffeine use Coffee. No drug use Tobacco use Never smoker.  Family History (Haru Anspaugh C. Pierce Biagini, MD; 06/03/2020 2:24 PM) Heart Disease Father, Mother. Hypertension Father.  Other Problems (Mandalyn Pasqua C. Hallis Meditz, MD; 06/03/2020 2:24 PM) Cholelithiasis    Vitals (Chanel Nolan CMA; 06/03/2020 2:02 PM) 06/03/2020 2:02 PM Weight: 184.13 lb Height: 70in Body Surface Area: 2.02 m Body Mass Index: 26.42 kg/m  Temp.: 97.2F  Pulse: 103 (Regular)  BP: 124/80(Sitting, Left Arm, Standard)        Physical Exam (Sophiarose Eades C. Zia Kanner MD; 06/03/2020 2:42 PM)  General Mental Status-Alert. General Appearance-Not in acute distress. Voice-Normal. Note: Not toxics or sickly. Sitting & moving normally.  Integumentary Global Assessment Upon inspection and palpation of skin surfaces of the - Distribution of scalp and body hair is normal. General Characteristics Overall examination of the patient's skin reveals - no rashes and no suspicious lesions.  Head and Neck Head-normocephalic, atraumatic with no lesions or palpable masses. Face Global Assessment - atraumatic, no absence of expression. Neck Global Assessment - no abnormal movements, no decreased range of motion. Trachea-midline. Thyroid Gland Characteristics - non-tender.  Eye Eyeball - Left-Extraocular movements intact, No Nystagmus - Left. Eyeball - Right-Extraocular movements intact, No Nystagmus - Right. Upper Eyelid - Left-No Cyanotic - Left. Upper Eyelid - Right-No Cyanotic - Right. Note: Wears glasses. Vision corrected  Chest and Lung Exam Inspection Accessory muscles - No use of accessory muscles in breathing.  Male Genitourinary Note: Left inguinal hernia and groin reducible. Mildly sensitive. Mild impulse on right side with cough suspicious for  small right inguinal hernia as well. Otherwise normal external male genitalia  Rectal Note: Perianal skin clear. Mild changes but no pruritus.  Intact sphincter tone. No   external wounds.  Peripheral Vascular Upper Extremity Inspection - Left - Not Gangrenous, No Petechiae. Inspection - Right - Not Gangrenous, No Petechiae.  Neurologic Neurologic evaluation reveals -normal attention span and ability to concentrate, able to name objects and repeat phrases. Appropriate fund of knowledge and normal coordination.  Neuropsychiatric Mental status exam performed with findings of-able to articulate well with normal speech/language, rate, volume and coherence and no evidence of hallucinations, delusions, obsessions or homicidal/suicidal ideation. Orientation-oriented X3.  Musculoskeletal Global Assessment Gait and Station - normal gait and station.  Lymphatic General Lymphatics Description - No Generalized lymphadenopathy.    Assessment & Plan (Jeffry Vogelsang C. Francee Setzer MD; 06/03/2020 2:40 PM)  LEFT INGUINAL HERNIA (K40.90) Impression: More obvious left inguinal hernia. I suspect that is what made him feel "like having kicked in the nuts" for 2 weeks. Mild impulse on right side.  Again went over the indications for surgery and the technique. I think he would benefit from hernia repair since they're inguinal. He is interested in proceeding sometime this year. I would wait more than 3 months from his initial surgery in November. Sounds like he wanted to it in the spring. He will call and let us know.   PREOP - ING HERNIA - ENCOUNTER FOR PREOPERATIVE EXAMINATION FOR GENERAL SURGICAL PROCEDURE (Z01.818)  Current Plans You are being scheduled for surgery- Our schedulers will call you.  You should hear from our office's scheduling department within 5 working days about the location, date, and time of surgery. We try to make accommodations for patient's preferences in scheduling  surgery, but sometimes the OR schedule or the surgeon's schedule prevents us from making those accommodations.  If you have not heard from our office ([phone]) in 5 working days, call the office and ask for your surgeon's nurse.  If you have other questions about your diagnosis, plan, or surgery, call the office and ask for your surgeon's nurse.  Written instructions provided The anatomy & physiology of the abdominal wall and pelvic floor was discussed. The pathophysiology of hernias in the inguinal and pelvic region was discussed. Natural history risks such as progressive enlargement, pain, incarceration, and strangulation was discussed. Contributors to complications such as smoking, obesity, diabetes, prior surgery, etc were discussed.  I feel the risks of no intervention will lead to serious problems that outweigh the operative risks; therefore, I recommended surgery to reduce and repair the hernia. I explained laparoscopic techniques with possible need for an open approach. I noted usual use of mesh to patch and/or buttress hernia repair  Risks such as bleeding, infection, abscess, need for further treatment, heart attack, death, and other risks were discussed. I noted a good likelihood this will help address the problem. Goals of post-operative recovery were discussed as well. Possibility that this will not correct all symptoms was explained. I stressed the importance of low-impact activity, aggressive pain control, avoiding constipation, & not pushing through pain to minimize risk of post-operative chronic pain or injury. Possibility of reherniation was discussed. We will work to minimize complications.  An educational handout further explaining the pathology & treatment options was given as well. Questions were answered. The patient expresses understanding & wishes to proceed with surgery.  Pt Education - Pamphlet Given - Laparoscopic Hernia Repair: discussed with  patient and provided information. Pt Education - [facility] Pain Control (Darnell Stimson) Pt Education - [facility] Hernia Post-Op HCI (Jason Hauge): discussed with patient and provided information. Pt Education - [facility] Mesh education: discussed with patient and provided information.  PROLAPSED INTERNAL   HEMORRHOIDS, GRADE 2 (K64.1) Impression: Recovering status post ligation and pexy of prolapsing hemorrhoids.  Continue bowel regimen and follow-up  He is concerned is a little more loose but no incontinence. Sphincter tone normal and intact. Can try pelvic floor Kegel exercises see if that'll help tighten things up.  The anatomy & physiology of the anorectal region was discussed. The pathophysiology of hemorrhoids and differential diagnosis was discussed. Natural history progression was discussed. I stressed the importance of a bowel regimen to have daily soft bowel movements to minimize progression of disease. Goal of one BM / day ideal. Use of wet wipes, warm baths, avoiding straining, etc were emphasized.  Educational handouts further explaining the pathology, treatment options, and bowel regimen were given as well. The patient expressed understanding.  Current Plans Return to clinic as needed.  Soreness, decreased appetite, and poor energy level are common problems after surgery. While many people can struggle with a bad day, these concerns should gradually fade away or at least improve. Much of your recovery depends on your health & the severity of your operation. Please call if you have any further questions / concerns related to surgery.  Increase activity as tolerated to regular everyday activity. Consider daily low impact exercise every day such as walking an hour a day.  Do not push through pain. If it hurts to do it, then don't do it.  Diet as tolerated. Low fat high fiber diet ideal. 30 g fiber a day ideal. Consider taking a daily fiber supplement to keep your bowels regular.  Followup  with your primary care physician for other health issues as would normally be done.  Consider screening for malignancies (breast, prostate, colon, melanoma, etc) as appropriate. Discuss with you primary care physician.  Pt Education - [facility] Hemorrhoids (Jermal Dismuke): discussed with patient and provided information. Pt Education - [facility] Pelvic Floor Exercises (Kegels) and Dysfunction HCI (Analyn Matusek)  ANAL POLYP (K62.0) Impression: Recovering status post excision of benign feeling right anterior anal canal polyp.  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 I'm skeptical he truly has major condylomatous disease.  Copy pathology already given.   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 Most likely due to stress and not wart/condyloma disease.  Follow to make sure it heals.  Current Plans  Keir Viernes C. Sunjai Levandoski, MD, FACS, MASCRS Gastrointestinal and Minimally Invasive Surgery  [facility] [address], [address] [place], [place] [address] [phone] Fax [phone] Main/Paging  CONTACT INFORMATION: Weekday (9AM-5PM) concerns: Call [facility] main office at [phone] Weeknight (5PM-9AM) or Weekend/Holiday concerns: Check [email or web address] for General Surgery [facility] coverage (Please, do not use SecureChat as it is not reliable communication to operating surgeons for immediate patient care)   

## 2020-09-21 ENCOUNTER — Ambulatory Visit: Payer: Self-pay | Admitting: Surgery

## 2020-09-21 ENCOUNTER — Other Ambulatory Visit (HOSPITAL_COMMUNITY)
Admission: RE | Admit: 2020-09-21 | Discharge: 2020-09-21 | Disposition: A | Discharge status: Home / Self Care | Payer: Medicare HMO | Source: Ambulatory Visit | Attending: Surgery | Admitting: Surgery

## 2020-09-21 DIAGNOSIS — Z01812 Encounter for preprocedural laboratory examination: Secondary | ICD-10-CM | POA: Diagnosis present

## 2020-09-21 DIAGNOSIS — Z20822 Contact with and (suspected) exposure to covid-19: Secondary | ICD-10-CM | POA: Diagnosis not present

## 2020-09-21 NOTE — H&P (View-Only) (Signed)
Colin Powell Appointment: 06/03/2020 1:45 PM Location: [facility] Patient #: [id number] DOB: 1955-10-25 Married / Language: Colin Powell / Race: White Male  History of Present Illness [name] MD; 06/03/2020 2:42 PM) The patient is a 65 year old male who presents with hemorrhoids. Note for "Hemorrhoids": ` ` ` The patient returns s/p hemorrhoidal ligation and pexy with excision of hemorrhoids and anal mass 04/09/2020      The patient returns to clinic after surgery, gradually improving. Denies any pain. Having regular bowel movements. No fevers or chills. Does feel like he is a little looser back there. Flatus sounds at a lower octave. However no incontinence.  Notes he still has his left groin hernia. Wondered if he could consider surgery to fix that as we discussed last year. No problems with urination or defecation. Staying on a fiber supplement. No fevers or chills.   ###########################################  Pathology: Hemorrhoids 3. 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 Medications Reconciled  Social History [name], MD; 06/03/2020 2:24 PM) Alcohol use Moderate alcohol use. Caffeine use Coffee. No drug use Tobacco use Never smoker.  Family History [name], MD; 06/03/2020 2:24 PM) Heart Disease Father, Mother. Hypertension Father.  Other Problems [name], MD; 06/03/2020 2:24 PM) Cholelithiasis    Vitals (Chanel Nolan CMA; 06/03/2020 2:02 PM) 06/03/2020 2:02 PM Weight: 184.13 lb Height: 70in Body Surface Area: 2.02 m Body Mass Index: 26.42 kg/m  Temp.: 97.76F  Pulse: 103 (Regular)  BP: 124/80(Sitting, Left Arm, Standard)        Physical Exam [name] MD; 06/03/2020 2:42 PM)  General Mental Status-Alert. General Appearance-Not in acute distress. Voice-Normal. Note: Not toxics or sickly. Sitting & moving normally.  Integumentary Global Assessment Upon inspection and palpation of skin surfaces of the - Distribution of scalp and body hair is normal. General Characteristics Overall examination of the patient's skin reveals - no rashes and no suspicious lesions.  Head and Neck Head-normocephalic, atraumatic with no lesions or palpable masses. Face Global Assessment - atraumatic, no absence of expression. Neck Global Assessment - no abnormal movements, no decreased range of motion. Trachea-midline. Thyroid Gland Characteristics - non-tender.  Eye Eyeball - Left-Extraocular movements intact, No Nystagmus - Left. Eyeball - Right-Extraocular movements intact, No Nystagmus - Right. Upper Eyelid - Left-No Cyanotic - Left. Upper Eyelid - Right-No Cyanotic - Right. Note: Wears glasses. Vision corrected  Chest and Lung Exam Inspection Accessory muscles - No use of accessory muscles in breathing.  Male Genitourinary Note: Left inguinal hernia and groin reducible. Mildly sensitive. Mild impulse on right side with cough suspicious for  small right inguinal hernia as well. Otherwise normal external male genitalia  Rectal Note: Perianal skin clear. Mild changes but no pruritus.  Intact sphincter tone. No  external wounds.  Peripheral Vascular Upper Extremity Inspection - Left - Not Gangrenous, No Petechiae. Inspection - Right - Not Gangrenous, No Petechiae.  Neurologic Neurologic evaluation reveals -normal attention span and ability to concentrate, able to name objects and repeat phrases. Appropriate fund of knowledge and normal coordination.  Neuropsychiatric Mental status exam performed with findings of-able to articulate well with normal speech/language, rate, volume and coherence and no evidence of hallucinations, delusions, obsessions or homicidal/suicidal ideation. Orientation-oriented X3.  Musculoskeletal Global Assessment Gait and Station - normal gait and station.  Lymphatic General Lymphatics Description - No Generalized lymphadenopathy.    Assessment & Plan (Jabier Deese C. Kylie Simmonds MD; 06/03/2020 2:40 PM)  LEFT INGUINAL HERNIA (K40.90) Impression: More obvious left inguinal hernia. I suspect that is what made him feel "like having kicked in the nuts" for 2 weeks. Mild impulse on right side.  Again went over the indications for surgery and the technique. I think he would benefit from hernia repair since they're inguinal. He is interested in proceeding sometime this year. I would wait more than 3 months from his initial surgery in November. Sounds like he wanted to it in the spring. He will call and let us know.   PREOP - ING HERNIA - ENCOUNTER FOR PREOPERATIVE EXAMINATION FOR GENERAL SURGICAL PROCEDURE (Z01.818)  Current Plans You are being scheduled for surgery- Our schedulers will call you.  You should hear from our office's scheduling department within 5 working days about the location, date, and time of surgery. We try to make accommodations for patient's preferences in scheduling  surgery, but sometimes the OR schedule or the surgeon's schedule prevents us from making those accommodations.  If you have not heard from our office ([phone]) in 5 working days, call the office and ask for your surgeon's nurse.  If you have other questions about your diagnosis, plan, or surgery, call the office and ask for your surgeon's nurse.  Written instructions provided The anatomy & physiology of the abdominal wall and pelvic floor was discussed. The pathophysiology of hernias in the inguinal and pelvic region was discussed. Natural history risks such as progressive enlargement, pain, incarceration, and strangulation was discussed. Contributors to complications such as smoking, obesity, diabetes, prior surgery, etc were discussed.  I feel the risks of no intervention will lead to serious problems that outweigh the operative risks; therefore, I recommended surgery to reduce and repair the hernia. I explained laparoscopic techniques with possible need for an open approach. I noted usual use of mesh to patch and/or buttress hernia repair  Risks such as bleeding, infection, abscess, need for further treatment, heart attack, death, and other risks were discussed. I noted a good likelihood this will help address the problem. Goals of post-operative recovery were discussed as well. Possibility that this will not correct all symptoms was explained. I stressed the importance of low-impact activity, aggressive pain control, avoiding constipation, & not pushing through pain to minimize risk of post-operative chronic pain or injury. Possibility of reherniation was discussed. We will work to minimize complications.  An educational handout further explaining the pathology & treatment options was given as well. Questions were answered. The patient expresses understanding & wishes to proceed with surgery.  Pt Education - Pamphlet Given - Laparoscopic Hernia Repair: discussed with  patient and provided information. Pt Education - [facility] Pain Control (Enslie Sahota) Pt Education - [facility] Hernia Post-Op HCI (Britnie Colville): discussed with patient and provided information. Pt Education - [facility] Mesh education: discussed with patient and provided information.  PROLAPSED INTERNAL   HEMORRHOIDS, GRADE 2 (K64.1) Impression: Recovering status post ligation and pexy of prolapsing hemorrhoids.  Continue bowel regimen and follow-up  He is concerned is a little more loose but no incontinence. Sphincter tone normal and intact. Can try pelvic floor Kegel exercises see if that'll help tighten things up.  The anatomy & physiology of the anorectal region was discussed. The pathophysiology of hemorrhoids and differential diagnosis was discussed. Natural history progression was discussed. I stressed the importance of a bowel regimen to have daily soft bowel movements to minimize progression of disease. Goal of one BM / day ideal. Use of wet wipes, warm baths, avoiding straining, etc were emphasized.  Educational handouts further explaining the pathology, treatment options, and bowel regimen were given as well. The patient expressed understanding.  Current Plans Return to clinic as needed.  Soreness, decreased appetite, and poor energy level are common problems after surgery. While many people can struggle with a bad day, these concerns should gradually fade away or at least improve. Much of your recovery depends on your health & the severity of your operation. Please call if you have any further questions / concerns related to surgery.  Increase activity as tolerated to regular everyday activity. Consider daily low impact exercise every day such as walking an hour a day.  Do not push through pain. If it hurts to do it, then don't do it.  Diet as tolerated. Low fat high fiber diet ideal. 30 g fiber a day ideal. Consider taking a daily fiber supplement to keep your bowels regular.  Followup  with your primary care physician for other health issues as would normally be done.  Consider screening for malignancies (breast, prostate, colon, melanoma, etc) as appropriate. Discuss with you primary care physician.  Pt Education - [facility] Hemorrhoids (Aspin Palomarez): discussed with patient and provided information. Pt Education - [facility] Pelvic Floor Exercises (Kegels) and Dysfunction HCI (Jerrard Bradburn)  ANAL POLYP (K62.0) Impression: Recovering status post excision of benign feeling right anterior anal canal polyp.  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 I'm skeptical he truly has major condylomatous disease.  Copy pathology already given.   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 Most likely due to stress and not wart/condyloma disease.  Follow to make sure it heals.  Current Plans  Istvan Behar C. Ernest Orr, MD, FACS, MASCRS Gastrointestinal and Minimally Invasive Surgery  [facility] [address], [address] [place], [place] [address] [phone] Fax [phone] Main/Paging  CONTACT INFORMATION: Weekday (9AM-5PM) concerns: Call [facility] main office at [phone] Weeknight (5PM-9AM) or Weekend/Holiday concerns: Check [email or web address] for General Surgery [facility] coverage (Please, do not use SecureChat as it is not reliable communication to operating surgeons for immediate patient care)   

## 2020-09-21 NOTE — H&P (Signed)
[facility] Appointment: 06/03/2020 1:45 PM Location: [facility] Patient #: [id number] DOB: 1955-10-25 Married / Language: Cleophus Molt / Race: White Male  History of Present Illness [name] MD; 06/03/2020 2:42 PM) The patient is a 65 year old male who presents with hemorrhoids. Note for "Hemorrhoids": ` ` ` The patient returns s/p hemorrhoidal ligation and pexy with excision of hemorrhoids and anal mass 04/09/2020      The patient returns to clinic after surgery, gradually improving. Denies any pain. Having regular bowel movements. No fevers or chills. Does feel like he is a little looser back there. Flatus sounds at a lower octave. However no incontinence.  Notes he still has his left groin hernia. Wondered if he could consider surgery to fix that as we discussed last year. No problems with urination or defecation. Staying on a fiber supplement. No fevers or chills.   ###########################################  Pathology: Hemorrhoids 3. 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 Medications Reconciled  Social History [name], MD; 06/03/2020 2:24 PM) Alcohol use Moderate alcohol use. Caffeine use Coffee. No drug use Tobacco use Never smoker.  Family History [name], MD; 06/03/2020 2:24 PM) Heart Disease Father, Mother. Hypertension Father.  Other Problems [name], MD; 06/03/2020 2:24 PM) Cholelithiasis    Vitals (Chanel Nolan CMA; 06/03/2020 2:02 PM) 06/03/2020 2:02 PM Weight: 184.13 lb Height: 70in Body Surface Area: 2.02 m Body Mass Index: 26.42 kg/m  Temp.: 97.76F  Pulse: 103 (Regular)  BP: 124/80(Sitting, Left Arm, Standard)        Physical Exam [name] MD; 06/03/2020 2:42 PM)  General Mental Status-Alert. General Appearance-Not in acute distress. Voice-Normal. Note: Not toxics or sickly. Sitting & moving normally.  Integumentary Global Assessment Upon inspection and palpation of skin surfaces of the - Distribution of scalp and body hair is normal. General Characteristics Overall examination of the patient's skin reveals - no rashes and no suspicious lesions.  Head and Neck Head-normocephalic, atraumatic with no lesions or palpable masses. Face Global Assessment - atraumatic, no absence of expression. Neck Global Assessment - no abnormal movements, no decreased range of motion. Trachea-midline. Thyroid Gland Characteristics - non-tender.  Eye Eyeball - Left-Extraocular movements intact, No Nystagmus - Left. Eyeball - Right-Extraocular movements intact, No Nystagmus - Right. Upper Eyelid - Left-No Cyanotic - Left. Upper Eyelid - Right-No Cyanotic - Right. Note: Wears glasses. Vision corrected  Chest and Lung Exam Inspection Accessory muscles - No use of accessory muscles in breathing.  Male Genitourinary Note: Left inguinal hernia and groin reducible. Mildly sensitive. Mild impulse on right side with cough suspicious for  small right inguinal hernia as well. Otherwise normal external male genitalia  Rectal Note: Perianal skin clear. Mild changes but no pruritus.  Intact sphincter tone. No  external wounds.  Peripheral Vascular Upper Extremity Inspection - Left - Not Gangrenous, No Petechiae. Inspection - Right - Not Gangrenous, No Petechiae.  Neurologic Neurologic evaluation reveals -normal attention span and ability to concentrate, able to name objects and repeat phrases. Appropriate fund of knowledge and normal coordination.  Neuropsychiatric Mental status exam performed with findings of-able to articulate well with normal speech/language, rate, volume and coherence and no evidence of hallucinations, delusions, obsessions or homicidal/suicidal ideation. Orientation-oriented X3.  Musculoskeletal Global Assessment Gait and Station - normal gait and station.  Lymphatic General Lymphatics Description - No Generalized lymphadenopathy.    Assessment & Plan [name] MD; 06/03/2020 2:40 PM)  LEFT INGUINAL HERNIA (K40.90) Impression: More obvious left inguinal hernia. I suspect that is what made him feel "like having kicked in the nuts" for 2 weeks. Mild impulse on right side.  Again went over the indications for surgery and the technique. I think he would benefit from hernia repair since they're inguinal. He is interested in proceeding sometime this year. I would wait more than 3 months from his initial surgery in November. Sounds like he wanted to it in the spring. He will call and let us know.   PREOP - ING HERNIA - ENCOUNTER FOR PREOPERATIVE EXAMINATION FOR GENERAL SURGICAL PROCEDURE (Z01.818)  Current Plans You are being scheduled for surgery- Our schedulers will call you.  You should hear from our office's scheduling department within 5 working days about the location, date, and time of surgery. We try to make accommodations for patient's preferences in scheduling  surgery, but sometimes the OR schedule or the surgeon's schedule prevents [place] from making those accommodations.  If you have not heard from our office [phone]) in 5 working days, call the office and ask for your surgeon's nurse.  If you have other questions about your diagnosis, plan, or surgery, call the office and ask for your surgeon's nurse.  Written instructions provided The anatomy & physiology of the abdominal wall and pelvic floor was discussed. The pathophysiology of hernias in the inguinal and pelvic region was discussed. Natural history risks such as progressive enlargement, pain, incarceration, and strangulation was discussed. Contributors to complications such as smoking, obesity, diabetes, prior surgery, etc were discussed.  I feel the risks of no intervention will lead to serious problems that outweigh the operative risks; therefore, I recommended surgery to reduce and repair the hernia. I explained laparoscopic techniques with possible need for an open approach. I noted usual use of mesh to patch and/or buttress hernia repair  Risks such as bleeding, infection, abscess, need for further treatment, heart attack, death, and other risks were discussed. I noted a good likelihood this will help address the problem. Goals of post-operative recovery were discussed as well. Possibility that this will not correct all symptoms was explained. I stressed the importance of low-impact activity, aggressive pain control, avoiding constipation, & not pushing through pain to minimize risk of post-operative chronic pain or injury. Possibility of reherniation was discussed. We will work to minimize complications.  An educational handout further explaining the pathology & treatment options was given as well. Questions were answered. The patient expresses understanding & wishes to proceed with surgery.  Pt Education - Pamphlet Given - Laparoscopic Hernia Repair: discussed with  patient and provided information. Pt Education - [facility] Pain Control (Keymani Glynn) Pt Education - [facility] Hernia Post-Op HCI (Takahiro Godinho): discussed with patient and provided information. Pt Education - [facility] Mesh education: discussed with patient and provided information.  PROLAPSED INTERNAL  HEMORRHOIDS, GRADE 2 (K64.1) Impression: Recovering status post ligation and pexy of prolapsing hemorrhoids.  Continue bowel regimen and follow-up  He is concerned is a little more loose but no incontinence. Sphincter tone normal and intact. Can try pelvic floor Kegel exercises see if that'll help tighten things up.  The anatomy & physiology of the anorectal region was discussed. The pathophysiology of hemorrhoids and differential diagnosis was discussed. Natural history progression was discussed. I stressed the importance of a bowel regimen to have daily soft bowel movements to minimize progression of disease. Goal of one BM / day ideal. Use of wet wipes, warm baths, avoiding straining, etc were emphasized.  Educational handouts further explaining the pathology, treatment options, and bowel regimen were given as well. The patient expressed understanding.  Current Plans Return to clinic as needed.  Soreness, decreased appetite, and poor energy level are common problems after surgery. While many people can struggle with a bad day, these concerns should gradually fade away or at least improve. Much of your recovery depends on your health & the severity of your operation. Please call if you have any further questions / concerns related to surgery.  Increase activity as tolerated to regular everyday activity. Consider daily low impact exercise every day such as walking an hour a day.  Do not push through pain. If it hurts to do it, then don't do it.  Diet as tolerated. Low fat high fiber diet ideal. 30 g fiber a day ideal. Consider taking a daily fiber supplement to keep your bowels regular.  Followup  with your primary care physician for other health issues as would normally be done.  Consider screening for malignancies (breast, prostate, colon, melanoma, etc) as appropriate. Discuss with you primary care physician.  Pt Education - [facility] Hemorrhoids (Breann Losano): discussed with patient and provided information. Pt Education - [facility] Pelvic Floor Exercises (Kegels) and Dysfunction HCI (Bettylee Feig)  ANAL POLYP (K62.0) Impression: Recovering status post excision of benign feeling right anterior anal canal polyp.  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 I'm skeptical he truly has major condylomatous disease.  Copy pathology already given.   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 Most likely due to stress and not wart/condyloma disease.  Follow to make sure it heals.  Current Plans  [name], MD, FACS, MASCRS Gastrointestinal and Minimally Invasive Surgery  [facility] [address], [place], [place] [address] [phone] Fax [phone] Main/Paging  CONTACT INFORMATION: Weekday (9AM-5PM) concerns: Call [facility] main office at [phone] Weeknight (5PM-9AM) or Weekend/Holiday concerns: Check [email or web address] for General Surgery [facility] coverage (Please, do not use SecureChat as it is not reliable communication to operating surgeons for immediate patient care)

## 2020-09-22 ENCOUNTER — Encounter (HOSPITAL_BASED_OUTPATIENT_CLINIC_OR_DEPARTMENT_OTHER): Payer: Self-pay | Admitting: Surgery

## 2020-09-22 ENCOUNTER — Other Ambulatory Visit: Payer: Self-pay

## 2020-09-22 LAB — SARS CORONAVIRUS 2 (TAT 6-24 HRS): SARS Coronavirus 2: NEGATIVE

## 2020-09-22 NOTE — Progress Notes (Addendum)
Spoke w/ via phone for pre-op interview---pt Lab needs dos----I stat,  ekg               Lab results------none COVID test ------09-21-2020 negative result epic Arrive at -------830 am 09-24-2020 NPO after MN NO Solid Food.  Clear liquids from MN until---830 am 09-24-2020 Med rec completed Medications to take morning of surgery -----none Diabetic medication -----n/a Patient instructed to bring photo id and insurance card day of surgery Patient aware to have Driver (ride ) / caregiver  Wife charlene will stay   for 24 hours after surgery  Patient Special Instructions -----none Pre-Op special Istructions -----none Patient verbalized understanding of instructions that were given at this phone interview. Patient denies shortness of breath, chest pain, fever, cough at this phone interview.  Pt wishes to not get ensure presurgery drink prior to surgery

## 2020-09-24 ENCOUNTER — Ambulatory Visit (HOSPITAL_BASED_OUTPATIENT_CLINIC_OR_DEPARTMENT_OTHER)
Admission: RE | Admit: 2020-09-24 | Discharge: 2020-09-24 | Disposition: A | Discharge status: Home / Self Care | Payer: Medicare HMO | Attending: Surgery | Admitting: Surgery

## 2020-09-24 ENCOUNTER — Encounter (HOSPITAL_BASED_OUTPATIENT_CLINIC_OR_DEPARTMENT_OTHER): Payer: Self-pay | Admitting: Surgery

## 2020-09-24 ENCOUNTER — Encounter (HOSPITAL_BASED_OUTPATIENT_CLINIC_OR_DEPARTMENT_OTHER)
Admission: RE | Disposition: A | Discharge status: Home / Self Care | Payer: Self-pay | Source: Home / Self Care | Attending: Surgery

## 2020-09-24 ENCOUNTER — Ambulatory Visit (HOSPITAL_BASED_OUTPATIENT_CLINIC_OR_DEPARTMENT_OTHER): Payer: Medicare HMO | Admitting: Anesthesiology

## 2020-09-24 ENCOUNTER — Other Ambulatory Visit: Payer: Self-pay

## 2020-09-24 DIAGNOSIS — Z8619 Personal history of other infectious and parasitic diseases: Secondary | ICD-10-CM | POA: Diagnosis not present

## 2020-09-24 DIAGNOSIS — Z86010 Personal history of colonic polyps: Secondary | ICD-10-CM | POA: Diagnosis not present

## 2020-09-24 DIAGNOSIS — K4020 Bilateral inguinal hernia, without obstruction or gangrene, not specified as recurrent: Secondary | ICD-10-CM | POA: Insufficient documentation

## 2020-09-24 DIAGNOSIS — K429 Umbilical hernia without obstruction or gangrene: Secondary | ICD-10-CM | POA: Insufficient documentation

## 2020-09-24 DIAGNOSIS — Z8719 Personal history of other diseases of the digestive system: Secondary | ICD-10-CM | POA: Insufficient documentation

## 2020-09-24 HISTORY — DX: Unilateral inguinal hernia, without obstruction or gangrene, not specified as recurrent: K40.90

## 2020-09-24 HISTORY — DX: Presence of spectacles and contact lenses: Z97.3

## 2020-09-24 HISTORY — PX: UMBILICAL HERNIA REPAIR: SHX196

## 2020-09-24 HISTORY — PX: INGUINAL HERNIA REPAIR: SHX194

## 2020-09-24 HISTORY — DX: Essential (primary) hypertension: I10

## 2020-09-24 LAB — POCT I-STAT, CHEM 8
BUN: 14 mg/dL (ref 8–23)
Calcium, Ion: 1.31 mmol/L (ref 1.15–1.40)
Chloride: 102 mmol/L (ref 98–111)
Creatinine, Ser: 1 mg/dL (ref 0.61–1.24)
Glucose, Bld: 103 mg/dL — ABNORMAL HIGH (ref 70–99)
HCT: 45 % (ref 39.0–52.0)
Hemoglobin: 15.3 g/dL (ref 13.0–17.0)
Potassium: 4.7 mmol/L (ref 3.5–5.1)
Sodium: 143 mmol/L (ref 135–145)
TCO2: 28 mmol/L (ref 22–32)

## 2020-09-24 SURGERY — REPAIR, HERNIA, INGUINAL, LAPAROSCOPIC
Anesthesia: General | Site: Abdomen

## 2020-09-24 MED ORDER — OXYCODONE HCL 5 MG PO TABS
ORAL_TABLET | ORAL | Status: AC
  Filled 2020-09-24: qty 1

## 2020-09-24 MED ORDER — PROPOFOL 10 MG/ML IV BOLUS
INTRAVENOUS | Status: DC | PRN
  Administered 2020-09-24: 200 mg via INTRAVENOUS

## 2020-09-24 MED ORDER — MIDAZOLAM HCL 2 MG/2ML IJ SOLN
INTRAMUSCULAR | Status: DC | PRN
  Administered 2020-09-24: 2 mg via INTRAVENOUS

## 2020-09-24 MED ORDER — EPHEDRINE SULFATE-NACL 50-0.9 MG/10ML-% IV SOSY
PREFILLED_SYRINGE | INTRAVENOUS | Status: DC | PRN
  Administered 2020-09-24: 15 mg via INTRAVENOUS

## 2020-09-24 MED ORDER — EPHEDRINE 5 MG/ML INJ
INTRAVENOUS | Status: AC
  Filled 2020-09-24: qty 10

## 2020-09-24 MED ORDER — CEFAZOLIN SODIUM-DEXTROSE 2-4 GM/100ML-% IV SOLN
INTRAVENOUS | Status: AC
  Filled 2020-09-24: qty 100

## 2020-09-24 MED ORDER — OXYCODONE HCL 5 MG/5ML PO SOLN
5.0000 mg | Freq: Once | ORAL | Status: AC | PRN
Start: 2020-09-24 — End: 2020-09-24

## 2020-09-24 MED ORDER — GABAPENTIN 300 MG PO CAPS
ORAL_CAPSULE | ORAL | Status: AC
  Filled 2020-09-24: qty 1

## 2020-09-24 MED ORDER — KETOROLAC TROMETHAMINE 30 MG/ML IJ SOLN
INTRAMUSCULAR | Status: AC
  Filled 2020-09-24: qty 1

## 2020-09-24 MED ORDER — BUPIVACAINE-EPINEPHRINE 0.25% -1:200000 IJ SOLN
INTRAMUSCULAR | Status: DC | PRN
  Administered 2020-09-24: 50 mL

## 2020-09-24 MED ORDER — LIDOCAINE 2% (20 MG/ML) 5 ML SYRINGE
INTRAMUSCULAR | Status: DC | PRN
  Administered 2020-09-24: 60 mg via INTRAVENOUS

## 2020-09-24 MED ORDER — TRAMADOL HCL 50 MG PO TABS: 50.0000 mg | ORAL_TABLET | Freq: Four times a day (QID) | ORAL | 0 refills | Status: AC | PRN

## 2020-09-24 MED ORDER — CHLORHEXIDINE GLUCONATE CLOTH 2 % EX PADS: 6.0000 | MEDICATED_PAD | Freq: Once | CUTANEOUS | Status: DC

## 2020-09-24 MED ORDER — LACTATED RINGERS IV SOLN: INTRAVENOUS | Status: DC

## 2020-09-24 MED ORDER — ROCURONIUM BROMIDE 10 MG/ML (PF) SYRINGE
PREFILLED_SYRINGE | INTRAVENOUS | Status: DC | PRN
  Administered 2020-09-24: 10 mg via INTRAVENOUS
  Administered 2020-09-24: 60 mg via INTRAVENOUS

## 2020-09-24 MED ORDER — LIDOCAINE 2% (20 MG/ML) 5 ML SYRINGE
INTRAMUSCULAR | Status: DC | PRN
  Administered 2020-09-24: 1.5 mg/kg/h via INTRAVENOUS

## 2020-09-24 MED ORDER — HYDRALAZINE HCL 20 MG/ML IJ SOLN
10.0000 mg | Freq: Once | INTRAMUSCULAR | Status: AC
  Administered 2020-09-24: 10 mg via INTRAVENOUS

## 2020-09-24 MED ORDER — DEXAMETHASONE SODIUM PHOSPHATE 10 MG/ML IJ SOLN
INTRAMUSCULAR | Status: AC
  Filled 2020-09-24: qty 1

## 2020-09-24 MED ORDER — ACETAMINOPHEN 500 MG PO TABS
1000.0000 mg | ORAL_TABLET | ORAL | Status: AC
  Administered 2020-09-24: 1000 mg via ORAL

## 2020-09-24 MED ORDER — ONDANSETRON HCL 4 MG/2ML IJ SOLN
INTRAMUSCULAR | Status: DC | PRN
  Administered 2020-09-24: 4 mg via INTRAVENOUS

## 2020-09-24 MED ORDER — BUPIVACAINE LIPOSOME 1.3 % IJ SUSP: 20.0000 mL | Freq: Once | INTRAMUSCULAR | Status: DC

## 2020-09-24 MED ORDER — FENTANYL CITRATE (PF) 100 MCG/2ML IJ SOLN
INTRAMUSCULAR | Status: AC
  Filled 2020-09-24: qty 2

## 2020-09-24 MED ORDER — CEFAZOLIN SODIUM-DEXTROSE 2-4 GM/100ML-% IV SOLN
2.0000 g | INTRAVENOUS | Status: AC
  Administered 2020-09-24: 2 g via INTRAVENOUS

## 2020-09-24 MED ORDER — BUPIVACAINE LIPOSOME 1.3 % IJ SUSP
INTRAMUSCULAR | Status: DC | PRN
  Administered 2020-09-24: 20 mL

## 2020-09-24 MED ORDER — LIDOCAINE 2% (20 MG/ML) 5 ML SYRINGE
INTRAMUSCULAR | Status: AC
  Filled 2020-09-24: qty 5

## 2020-09-24 MED ORDER — SUGAMMADEX SODIUM 200 MG/2ML IV SOLN
INTRAVENOUS | Status: DC | PRN
  Administered 2020-09-24: 200 mg via INTRAVENOUS

## 2020-09-24 MED ORDER — HYDRALAZINE HCL 20 MG/ML IJ SOLN
INTRAMUSCULAR | Status: AC
  Filled 2020-09-24: qty 1

## 2020-09-24 MED ORDER — KETOROLAC TROMETHAMINE 30 MG/ML IJ SOLN
INTRAMUSCULAR | Status: DC | PRN
  Administered 2020-09-24: 30 mg via INTRAVENOUS

## 2020-09-24 MED ORDER — ACETAMINOPHEN 500 MG PO TABS
ORAL_TABLET | ORAL | Status: AC
  Filled 2020-09-24: qty 2

## 2020-09-24 MED ORDER — FENTANYL CITRATE (PF) 100 MCG/2ML IJ SOLN
25.0000 ug | INTRAMUSCULAR | Status: DC | PRN
Start: 2020-09-24 — End: 2020-09-25

## 2020-09-24 MED ORDER — DEXAMETHASONE SODIUM PHOSPHATE 10 MG/ML IJ SOLN
INTRAMUSCULAR | Status: DC | PRN
  Administered 2020-09-24: 5 mg via INTRAVENOUS

## 2020-09-24 MED ORDER — ONDANSETRON HCL 4 MG/2ML IJ SOLN: 4.0000 mg | Freq: Once | INTRAMUSCULAR | Status: DC | PRN

## 2020-09-24 MED ORDER — OXYCODONE HCL 5 MG PO TABS
5.0000 mg | ORAL_TABLET | Freq: Once | ORAL | Status: AC | PRN
Start: 2020-09-24 — End: 2020-09-24
  Administered 2020-09-24: 5 mg via ORAL

## 2020-09-24 MED ORDER — MIDAZOLAM HCL 2 MG/2ML IJ SOLN
INTRAMUSCULAR | Status: AC
  Filled 2020-09-24: qty 2

## 2020-09-24 MED ORDER — FENTANYL CITRATE (PF) 100 MCG/2ML IJ SOLN
INTRAMUSCULAR | Status: DC | PRN
  Administered 2020-09-24 (×2): 50 ug via INTRAVENOUS

## 2020-09-24 MED ORDER — ROCURONIUM BROMIDE 10 MG/ML (PF) SYRINGE
PREFILLED_SYRINGE | INTRAVENOUS | Status: AC
  Filled 2020-09-24: qty 10

## 2020-09-24 MED ORDER — PROPOFOL 10 MG/ML IV BOLUS
INTRAVENOUS | Status: AC
  Filled 2020-09-24: qty 20

## 2020-09-24 MED ORDER — ONDANSETRON HCL 4 MG/2ML IJ SOLN
INTRAMUSCULAR | Status: AC
  Filled 2020-09-24: qty 2

## 2020-09-24 MED ORDER — ENSURE PRE-SURGERY PO LIQD: 296.0000 mL | Freq: Once | ORAL | Status: DC

## 2020-09-24 MED ORDER — GABAPENTIN 300 MG PO CAPS
300.0000 mg | ORAL_CAPSULE | ORAL | Status: AC
  Administered 2020-09-24: 300 mg via ORAL

## 2020-09-24 SURGICAL SUPPLY — 40 items
APL PRP STRL LF DISP 70% ISPRP (MISCELLANEOUS) ×2
CABLE HIGH FREQUENCY MONO STRZ (ELECTRODE) ×3 IMPLANT
CHLORAPREP W/TINT 26 (MISCELLANEOUS) ×3 IMPLANT
COVER WAND RF STERILE (DRAPES) ×3 IMPLANT
DECANTER SPIKE VIAL GLASS SM (MISCELLANEOUS) ×3 IMPLANT
DRAPE WARM FLUID 44X44 (DRAPES) ×3 IMPLANT
DRSG TEGADERM 2-3/8X2-3/4 SM (GAUZE/BANDAGES/DRESSINGS) ×3 IMPLANT
DRSG TEGADERM 4X4.75 (GAUZE/BANDAGES/DRESSINGS) ×3 IMPLANT
ELECT REM PT RETURN 9FT ADLT (ELECTROSURGICAL) ×3
ELECTRODE REM PT RTRN 9FT ADLT (ELECTROSURGICAL) ×2 IMPLANT
GAUZE SPONGE 2X2 12PLY NS (GAUZE/BANDAGES/DRESSINGS) ×3 IMPLANT
GLOVE SURG LTX SZ8 (GLOVE) ×3 IMPLANT
GLOVE SURG UNDER LTX SZ8 (GLOVE) ×3 IMPLANT
GLOVE SURG UNDER POLY LF SZ7 (GLOVE) ×12 IMPLANT
GLOVE SURG UNDER POLY LF SZ7.5 (GLOVE) ×6 IMPLANT
GOWN STRL REUS W/TWL LRG LVL3 (GOWN DISPOSABLE) ×12 IMPLANT
GOWN STRL REUS W/TWL XL LVL3 (GOWN DISPOSABLE) ×3 IMPLANT
KIT TURNOVER CYSTO (KITS) ×3 IMPLANT
MANIFOLD NEPTUNE II (INSTRUMENTS) ×3 IMPLANT
MESH HERNIA 6X6 BARD (Mesh General) ×2 IMPLANT
MESH HERNIA BARD 6X6 (Mesh General) ×1 IMPLANT
MESH ULTRAPRO 6X6 15CM15CM (Mesh General) ×6 IMPLANT
NEEDLE HYPO 22GX1.5 SAFETY (NEEDLE) ×3 IMPLANT
NS IRRIG 500ML POUR BTL (IV SOLUTION) ×3 IMPLANT
PACK BASIN DAY SURGERY FS (CUSTOM PROCEDURE TRAY) ×3 IMPLANT
PAD POSITIONING PINK XL (MISCELLANEOUS) ×3 IMPLANT
SCISSORS LAP 5X35 DISP (ENDOMECHANICALS) ×3 IMPLANT
SET TUBE SMOKE EVAC HIGH FLOW (TUBING) ×3 IMPLANT
SLEEVE ADV FIXATION 5X100MM (TROCAR) ×3 IMPLANT
SPONGE GAUZE 2X2 8PLY STRL LF (GAUZE/BANDAGES/DRESSINGS) ×9 IMPLANT
SUT MNCRL AB 4-0 PS2 18 (SUTURE) ×3 IMPLANT
SUT PDS AB 1 CT1 27 (SUTURE) ×6 IMPLANT
SUT VIC AB 2-0 SH 27 (SUTURE) ×6
SUT VIC AB 2-0 SH 27XBRD (SUTURE) ×4 IMPLANT
TOWEL OR 17X26 10 PK STRL BLUE (TOWEL DISPOSABLE) ×3 IMPLANT
TRAY DSU PREP LF (CUSTOM PROCEDURE TRAY) ×3 IMPLANT
TRAY LAPAROSCOPIC (CUSTOM PROCEDURE TRAY) ×3 IMPLANT
TROCAR ADV FIXATION 5X100MM (TROCAR) ×3 IMPLANT
TROCAR XCEL BLUNT TIP 100MML (ENDOMECHANICALS) ×3 IMPLANT
WATER STERILE IRR 500ML POUR (IV SOLUTION) ×3 IMPLANT

## 2020-09-24 NOTE — Interval H&P Note (Signed)
History and Physical Interval Note:  09/24/2020 10:15 AM  Colin Powell  has presented today for surgery, with the diagnosis of LEFT AND POSSIBLE RIGHT INGUINAL HERNIAS.  The various methods of treatment have been discussed with the patient and family. After consideration of risks, benefits and other options for treatment, the patient has consented to  Procedure(s): LAPAROSCOPIC LEFT AND POSSIBLE RIGHT INGUINAL HERNIA REPAIR WITH MESH (N/A) as a surgical intervention.  The patient's history has been reviewed, patient examined, no change in status, stable for surgery.  I have reviewed the patient's chart and labs.  Questions were answered to the patient's satisfaction.    I have re-reviewed the the patient's records, history, medications, and allergies.  I have re-examined the patient.  I again discussed intraoperative plans and goals of post-operative recovery.  The patient agrees to proceed.  Colin Powell  Jan 12, 1956 [id number]  Patient Care Team: Christain Sacramento, MD as PCP - General (Family Medicine) Michael Boston, MD as Consulting Physician (General Surgery)  There are no problems to display for this patient.   Past Medical History:  Diagnosis Date   COVID 03/2019   fever, chills congestion x 4 days allsymptoms resolved   Hypertension    Left inguinal hernia    Wears glasses     Past Surgical History:  Procedure Laterality Date   CHOLECYSTECTOMY  15 yrs ago   laparoscopic   hemorrhoids  03/2020   polyp removal  03/2020    Social History   Socioeconomic History   Marital status: Married    Spouse name: Not on file   Number of children: Not on file   Years of education: Not on file   Highest education level: Not on file  Occupational History   Not on file  Tobacco Use   Smoking status: Never Smoker   Smokeless tobacco: Never Used  Vaping Use   Vaping Use: Never used  Substance and Sexual Activity   Alcohol use: Yes    Comment: occ   Drug use: Never   Sexual  activity: Not on file  Other Topics Concern   Not on file  Social History Narrative   Not on file   Social Determinants of Health   Financial Resource Strain: Not on file  Food Insecurity: Not on file  Transportation Needs: Not on file  Physical Activity: Not on file  Stress: Not on file  Social Connections: Not on file  Intimate Partner Violence: Not on file    History reviewed. No pertinent family history.  Medications Prior to Admission  Medication Sig Dispense Refill Last Dose   acetaminophen (TYLENOL) 500 MG tablet Take 1,000 mg by mouth every 6 (six) hours as needed.   09/03/2020   ibuprofen (ADVIL) 200 MG tablet Take 200 mg by mouth every 6 (six) hours as needed. Takes 2 of 400 mg   09/17/2020   losartan (COZAAR) 100 MG tablet Take 100 mg by mouth daily.   09/22/2020   tadalafil (CIALIS) 10 MG tablet Take 10 mg by mouth daily as needed for erectile dysfunction.   More than a month at Unknown time   valACYclovir (VALTREX) 500 MG tablet Take 500 mg by mouth 2 (two) times daily as needed.   More than a month at Unknown time    Current Facility-Administered Medications  Medication Dose Route Frequency Provider Last Rate Last Admin   bupivacaine liposome (EXPAREL) 1.3 % injection 266 mg  20 mL Infiltration Once Michael Boston, MD  ceFAZolin (ANCEF) IVPB 2g/100 mL premix  2 g Intravenous On Call to OR Michael Boston, MD       Chlorhexidine Gluconate Cloth 2 % PADS 6 each  6 each Topical Once Michael Boston, MD       And   Chlorhexidine Gluconate Cloth 2 % PADS 6 each  6 each Topical Once Michael Boston, MD       Derrill Memo ON 09/25/2020] feeding supplement (ENSURE PRE-SURGERY) liquid 296 mL  296 mL Oral Once Michael Boston, MD       lactated ringers infusion   Intravenous Continuous Effie Berkshire, MD 50 mL/hr at 09/24/20 0950 New Bag at 09/24/20 0950     No Known Allergies  BP (S) (!) 174/109 Comment: Dr Fransisco Beau notfied. Orders recieved.  Pulse 67   Temp (!) 96.9 F (36.1 C)  (Oral)   Resp 14   Ht 5\' 10"  (1.778 m)   Wt 81.5 kg   SpO2 99%   BMI 25.77 kg/m   Labs: Results for orders placed or performed during the hospital encounter of 09/24/20 (from the past 48 hour(s))  I-STAT, chem 8     Status: Abnormal   Collection Time: 09/24/20  9:52 AM  Result Value Ref Range   Sodium 143 135 - 145 mmol/L   Potassium 4.7 3.5 - 5.1 mmol/L   Chloride 102 98 - 111 mmol/L   BUN 14 8 - 23 mg/dL   Creatinine, Ser 1.00 0.61 - 1.24 mg/dL   Glucose, Bld 103 (H) 70 - 99 mg/dL    Comment: Glucose reference range applies only to samples taken after fasting for at least 8 hours.   Calcium, Ion 1.31 1.15 - 1.40 mmol/L   TCO2 28 22 - 32 mmol/L   Hemoglobin 15.3 13.0 - 17.0 g/dL   HCT 45.0 39.0 - 52.0 %    Imaging / Studies: No results found.   [name], M.D., F.A.C.S. Gastrointestinal and Minimally Invasive Surgery [facility] [address], [place], [place] [address] [phone] Main / Paging  09/24/2020 10:15 AM    [name]

## 2020-09-24 NOTE — Anesthesia Preprocedure Evaluation (Addendum)
Anesthesia Evaluation  Patient identified by MRN, date of birth, ID band Patient awake    Reviewed: Allergy & Precautions, NPO status , Patient's Chart, lab work & pertinent test results  History of Anesthesia Complications Negative for: history of anesthetic complications  Airway Mallampati: III  TM Distance: >3 FB Neck ROM: Full    Dental  (+) Dental Advisory Given, Teeth Intact   Pulmonary neg pulmonary ROS,    Pulmonary exam normal        Cardiovascular hypertension, Pt. on medications Normal cardiovascular exam     Neuro/Psych negative neurological ROS  negative psych ROS   GI/Hepatic negative GI ROS, Neg liver ROS,   Endo/Other  negative endocrine ROS  Renal/GU negative Renal ROS     Musculoskeletal negative musculoskeletal ROS (+)   Abdominal   Peds  Hematology negative hematology ROS (+)   Anesthesia Other Findings Covid test negative   Reproductive/Obstetrics                            Anesthesia Physical Anesthesia Plan  ASA: II  Anesthesia Plan: General   Post-op Pain Management:    Induction: Intravenous  PONV Risk Score and Plan: 3 and Treatment may vary due to age or medical condition, Ondansetron, Midazolam and Dexamethasone  Airway Management Planned: Oral ETT  Additional Equipment: None  Intra-op Plan:   Post-operative Plan: Extubation in OR  Informed Consent: I have reviewed the patients History and Physical, chart, labs and discussed the procedure including the risks, benefits and alternatives for the proposed anesthesia with the patient or authorized representative who has indicated his/her understanding and acceptance.     Dental advisory given  Plan Discussed with: CRNA and Anesthesiologist  Anesthesia Plan Comments:        Anesthesia Quick Evaluation

## 2020-09-24 NOTE — Anesthesia Procedure Notes (Signed)

## 2020-09-24 NOTE — Op Note (Signed)
09/24/2020  12:34 PM  PATIENT:  Colin Powell  65 y.o. male  Patient Care Team: Christain Sacramento, MD as PCP - General (Family Medicine) Michael Boston, MD as Consulting Physician (General Surgery)  PRE-OPERATIVE DIAGNOSIS:  LEFT AND POSSIBLE RIGHT INGUINAL HERNIAS  POST-OPERATIVE DIAGNOSIS:  BILATERAL INGUINAL HERNIAS UMBILICAL HERNIA  PROCEDURE:   LAPAROSCOPIC BILATERAL INGUINAL HERNIA REPAIRS WITH MESH UMBILICAL HERNIA REPAIR - PRIMARY TRANSVERSUS ABDOMINIS PLANE (TAP) BLOCK - BILATERAL  SURGEON:  [name], MD  ASSISTANT: None  ANESTHESIA:     Regional ilioinguinal and genitofemoral and spermatic cord nerve blocks  General  Nerve block provided with liposomal bupivacaine (Experel) mixed with 0.25% bupivacaine as a Left TAP block x 41mL t the level of the transverse abdominis & preperitoneal spaces along the flank at the anterior axillary line, from subcostal ridge to iliac crest under laparoscopic guidance    EBL:  Total I/O In: 300 [I.V.:200; IV Piggyback:100] Out: - .  See anesthesia record  Delay start of Pharmacological VTE agent (>24hrs) due to surgical blood loss or risk of bleeding:  no  DRAINS: NONE  SPECIMEN:  NONE  DISPOSITION OF SPECIMEN:  N/A  COUNTS:  YES  PLAN OF CARE: Discharge to home after PACU  PATIENT DISPOSITION:  PACU - hemodynamically stable.  INDICATION: Pleasant gentleman with obvious left > right inguinal hernias.  I recommended exploration & repair of hernias found  The anatomy & physiology of the abdominal wall and pelvic floor was discussed.  The pathophysiology of hernias in the inguinal and pelvic region was discussed.  Natural history risks such as progressive enlargement, pain, incarceration & strangulation was discussed.   Contributors to complications such as smoking, obesity, diabetes, prior surgery, etc were discussed.    I feel the risks of no intervention will lead to serious problems that outweigh the operative risks;  therefore, I recommended surgery to reduce and repair the hernia.  I explained laparoscopic techniques with possible need for an open approach.  I noted usual use of mesh to patch and/or buttress hernia repair  Risks such as bleeding, infection, abscess, need for further treatment, heart attack, death, and other risks were discussed.  I noted a good likelihood this will help address the problem.   Goals of post-operative recovery were discussed as well.  Possibility that this will not correct all symptoms was explained.  I stressed the importance of low-impact activity, aggressive pain control, avoiding constipation, & not pushing through pain to minimize risk of post-operative chronic pain or injury. Possibility of reherniation was discussed.  We will work to minimize complications.     An educational handout further explaining the pathology & treatment options was given as well.  Questions were answered.  The patient expresses understanding & wishes to proceed with surgery.  OR FINDINGS: Direct greater than indirect left-sided pantaloon type inguinal hernia.  No femoral or obturator hernia.  On the right SIDE, moderate indirect inguinal hernia.  No direct space, femoral nor obturator hernias.  1 cm umbilical hernia within the stalk.  Primarily repaired.  DESCRIPTION:  The patient was identified & brought into the operating room. The patient was positioned supine with arms tucked. SCDs were active during the entire case. The patient underwent general anesthesia without any difficulty.  The abdomen was prepped and draped in a sterile fashion. The patient's bladder was emptied.  A Surgical Timeout confirmed our plan.  I made a transverse incision through the inferior umbilical fold.  I made a small transverse  nick through the anterior rectus fascia contralateral to the inguinal hernia side and placed a 0-vicryl stitch through the fascia.  I placed a Hasson trocar into the preperitoneal plane.  Entry  was clean.  We induced carbon dioxide insufflation. Camera inspection revealed no injury.  I used a 21mm angled scope to bluntly free the peritoneum off the infraumbilical anterior abdominal wall.  I created enough of a preperitoneal pocket to place 52mm ports into the right & left mid-abdomen into this preperitoneal cavity.  I focused attention on the LEFT pelvis since that was the dominant hernia side.   I used blunt & focused sharp dissection to free the peritoneum off the flank and down to the pubic rim.  I freed the anteriolateral bladder wall off the anteriolateral pelvic wall, sparing midline attachments.   I located a swath of peritoneum going into a hernia fascial defect at the  direct space consistent with  a direct space inguinal hernia..  Indirect inguinal hernia noted as well.  I gradually freed the peritoneal hernia sac off safely and reduced it into the preperitoneal space.  I freed the peritoneum off the spermatic vessels & vas deferens.  I freed peritoneum off the retroperitoneum along the psoas muscle.   Spermatic cord lipoma was dissected away & removed.  I checked & assured hemostasis.     I turned attention on the opposite  RIGHT pelvis.  I did dissection in a similar, mirror-image fashion. The patient had an indirect inguinal hernia..   Peritoneal hernia sac ligation done with 2-0 vicryl using intracorporeal laparoscopic suturing.   Spermatic cord lipoma was dissected away & removed.    I checked & assured hemostasis.     I chose 15x15 cm sheets for each side.  Because of the pantaloon double hernia on the left side and the direct space hernia being rather large, I used a standard medium weight polypropylene Bard Marlex mesh.  On the right because of a smaller, I used ultra-lightweight polypropylene mesh (Ultrapro).  I cut a single sigmoid-shaped slit ~6cm from a corner of each mesh.  I placed the meshes into the preperitoneal space & laid them as overlapping diamonds such that at the  inferior points, a 6x6 cm corner flap rested in the true anterolateral pelvis, covering the obturator & femoral foramina.   I allowed the bladder to return to the pubis, this helping tuck the corners of the mesh in the anteriolateral pelvis.  The medial corners overlapped each other across midline cephalad to the pubic rim.   Given the numerous hernias of moderate size, I placed a third 15x15cm mesh in the center as a vertical diamond.  The lateral wings of the mesh overlap across the direct spaces and internal rings where the dominant hernias were.  This provided good coverage and reinforcement of the hernia repairs.  Because of the central mesh placement with good overlap, I did not place any tacks.   I held the hernia sacs cephalad & evacuated carbon dioxide.  I freed the umbilical stalk off the anterior abdominal fascia to confirm umbilical hernia.  This is repaired using #1 PDS interrupted sutures.  I closed the 34mm port site fascia with absorbable suture.  I closed the skin using 4-0 monocryl stitch.  Sterile dressings were applied.   The patient was extubated & arrived in the PACU in stable condition..  I had discussed postoperative care with the patient in the holding area.  Instructions are written in the chart. I made  an attempt to locate family to discuss patient's status and recommendations.  Spouse not here & did not answer phone call.  No one is available at this time.  [name], M.D., F.A.C.S. Gastrointestinal and Minimally Invasive Surgery [facility] [address], [place], [place] [address] [phone] Main / Paging  09/24/2020 12:34 PM

## 2020-09-24 NOTE — Transfer of Care (Signed)
Immediate Anesthesia Transfer of Care Note  Patient: Colin Powell  Procedure(s) Performed: Procedure(s) (LRB): LAPAROSCOPIC LEFT AND RIGHT INGUINAL HERNIA REPAIR WITH MESH, TAP BLOCK (N/A) HERNIA REPAIR UMBILICAL ADULT  Patient Location: PACU  Anesthesia Type: General  Level of Consciousness: awake, oriented, sedated and patient cooperative  Airway & Oxygen Therapy: Patient Spontanous Breathing and Patient connected to face mask oxygen  Post-op Assessment: Report given to PACU RN and Post -op Vital signs reviewed and stable  Post vital signs: Reviewed and stable  Complications: No apparent anesthesia complications Last Vitals:  Vitals Value Taken Time  BP 174/88 09/24/20 1245  Temp    Pulse 87 09/24/20 1246  Resp 15 09/24/20 1246  SpO2 99 % 09/24/20 1246  Vitals shown include unvalidated device data.  Last Pain:  Vitals:   09/24/20 0918  TempSrc: Oral  PainSc: 0-No pain      Patients Stated Pain Goal: 5 (21/22/48 2500)  Complications: No complications documented.

## 2020-09-24 NOTE — Discharge Instructions (Signed)
HERNIA REPAIR: POST OP INSTRUCTIONS  ######################################################################  EAT Gradually transition to a high fiber diet with a fiber supplement over the next few weeks after discharge.  Start with a pureed / full liquid diet (see below)  WALK Walk an hour a day.  Control your pain to do that.    CONTROL PAIN Control pain so that you can walk, sleep, tolerate sneezing/coughing, and go up/down stairs.  HAVE A BOWEL MOVEMENT DAILY Keep your bowels regular to avoid problems.  OK to try a laxative to override constipation.  OK to use an antidairrheal to slow down diarrhea.  Call if not better after 2 tries  CALL IF YOU HAVE PROBLEMS/CONCERNS Call if you are still struggling despite following these instructions. Call if you have concerns not answered by these instructions  ######################################################################    1. DIET: Follow a light bland diet & liquids the first 24 hours after arrival home, such as soup, liquids, starches, etc.  Be sure to drink plenty of fluids.  Quickly advance to a usual solid diet within a few days.  Avoid fast food or heavy meals as your are more likely to get nauseated or have irregular bowels.  A low-fat, high-fiber diet for the rest of your life is ideal.   2. Take your usually prescribed home medications unless otherwise directed.  3. PAIN CONTROL: a. Pain is best controlled by a usual combination of three different methods TOGETHER: i. Ice/Heat ii. Over the counter pain medication iii. Prescription pain medication b. Most patients will experience some swelling and bruising around the hernia(s) such as the bellybutton, groins, or old incisions.  Ice packs or heating pads (30-60 minutes up to 6 times a day) will help. Use ice for the first few days to help decrease swelling and bruising, then switch to heat to help relax tight/sore spots and speed recovery.  Some people prefer to use ice  alone, heat alone, alternating between ice & heat.  Experiment to what works for you.  Swelling and bruising can take several weeks to resolve.   c. It is helpful to take an over-the-counter pain medication regularly for the first few weeks.  Choose one of the following that works best for you: i. Naproxen (Aleve, etc)  Two 244m tabs twice a day ii. Ibuprofen (Advil, etc) Three 2036mtabs four times a day (every meal & bedtime) iii. Acetaminophen (Tylenol, etc) 325-65016mour times a day (every meal & bedtime) d. A  prescription for pain medication should be given to you upon discharge.  Take your pain medication as prescribed.  i. If you are having problems/concerns with the prescription medicine (does not control pain, nausea, vomiting, rash, itching, etc), please call us [phone] see if we need to switch you to a different pain medicine that will work better for you and/or control your side effect better. ii. If you need a refill on your pain medication, please contact your pharmacy.  They will contact our office to request authorization. Prescriptions will not be filled after 5 pm or on week-ends.  4. Avoid getting constipated.  Between the surgery and the pain medications, it is common to experience some constipation.  Increasing fluid intake and taking a fiber supplement (such as Metamucil, Citrucel, FiberCon, MiraLax, etc) 1-2 times a day regularly will usually help prevent this problem from occurring.  A mild laxative (prune juice, Milk of Magnesia, MiraLax, etc) should be taken according to package directions if there are no bowel movements after 48  hours.    5. Wash / shower every day.  You may shower over the dressings as they are waterproof.    6. Remove your waterproof bandages, skin tapes, and other bandages 3 days after surgery. You may replace a dressing/Band-Aid to cover the incision for comfort if you wish. You may leave the incisions open to air.  You may replace a  dressing/Band-Aid to cover an incision for comfort if you wish.  Continue to shower over incision(s) after the dressing is off.  7. ACTIVITIES as tolerated:   a. You may resume regular (light) daily activities beginning the next day--such as daily self-care, walking, climbing stairs--gradually increasing activities as tolerated.  Control your pain so that you can walk an hour a day.  If you can walk 30 minutes without difficulty, it is safe to try more intense activity such as jogging, treadmill, bicycling, low-impact aerobics, swimming, etc. b. Save the most intensive and strenuous activity for last such as sit-ups, heavy lifting, contact sports, etc  Refrain from any heavy lifting or straining until you are off narcotics for pain control.   c. DO NOT PUSH THROUGH PAIN.  Let pain be your guide: If it hurts to do something, don't do it.  Pain is your body warning you to avoid that activity for another week until the pain goes down. d. You may drive when you are no longer taking prescription pain medication, you can comfortably wear a seatbelt, and you can safely maneuver your car and apply brakes. e. Dennis Bast may have sexual intercourse when it is comfortable.   8. FOLLOW UP in our office a. Please call [facility] at [phone] to set up an appointment to see your surgeon in the office for a follow-up appointment approximately 2-3 weeks after your surgery. b. Make sure that you call for this appointment the day you arrive home to insure a convenient appointment time.  9.  If you have disability of FMLA / Family leave forms, please bring the forms to the office for processing.  (do not give to your surgeon).  WHEN TO CALL us [phone]: 1. Poor pain control 2. Reactions / problems with new medications (rash/itching, nausea, etc)  3. Fever over 101.5 F (38.5 C) 4. Inability to urinate 5. Nausea and/or vomiting 6. Worsening swelling or bruising 7. Continued bleeding from incision. 8. Increased pain,  redness, or drainage from the incision   The clinic staff is available to answer your questions during regular business hours (8:30am-5pm).  Please don't hesitate to call and ask to speak to one of our nurses for clinical concerns.   If you have a medical emergency, go to the nearest emergency room or call 911.  A surgeon from [facility] is always on call at the hospitals in [facility], [facility], [place], [place], [place]  [address] ?  [address] [address], [place], [place]   [address] MAIN: [id number] ? TOLL FREE: [phone] ? FAX: [phone] [email or web address]   Information for Discharge Teaching: EXPAREL (bupivacaine liposome injectable suspension)   Your surgeon gave you EXPAREL(bupivacaine) in your surgical incision to help control your pain after surgery.   EXPAREL is a local anesthetic that provides pain relief by numbing the tissue around the surgical site.  EXPAREL is designed to release pain medication over time and can control pain for up to 72 hours.  Depending on how you respond to EXPAREL, you may require less pain medication during your  recovery.  Possible side effects:  Temporary loss of sensation or ability to move in the area where bupivacaine was injected.  Nausea, vomiting, constipation  Rarely, numbness and tingling in your mouth or lips, lightheadedness, or anxiety may occur.  Call your doctor right away if you think you may be experiencing any of these sensations, or if you have other questions regarding possible side effects.  Follow all other discharge instructions given to you by your surgeon or nurse. Eat a healthy diet and drink plenty of water or other fluids.  If you return to the hospital for any reason within 96 hours following the administration of EXPAREL, please inform your health care providers.     Post Anesthesia Home Care Instructions  Activity: Get plenty of rest for  the remainder of the day. A responsible individual must stay with you for 24 hours following the procedure.  For the next 24 hours, DO NOT: -Drive a car -Paediatric nurse -Drink alcoholic beverages -Take any medication unless instructed by your physician -Make any legal decisions or sign important papers.  Meals: Start with liquid foods such as gelatin or soup. Progress to regular foods as tolerated. Avoid greasy, spicy, heavy foods. If nausea and/or vomiting occur, drink only clear liquids until the nausea and/or vomiting subsides. Call your physician if vomiting continues.  Special Instructions/Symptoms: Your throat may feel dry or sore from the anesthesia or the breathing tube placed in your throat during surgery. If this causes discomfort, gargle with warm salt water. The discomfort should disappear within 24 hours.  If you had a scopolamine patch placed behind your ear for the management of post- operative nausea and/or vomiting:  1. The medication in the patch is effective for 72 hours, after which it should be removed.  Wrap patch in a tissue and discard in the trash. Wash hands thoroughly with soap and water. 2. You may remove the patch earlier than 72 hours if you experience unpleasant side effects which may include dry mouth, dizziness or visual disturbances. 3. Avoid touching the patch. Wash your hands with soap and water after contact with the patch.

## 2020-09-24 NOTE — Anesthesia Postprocedure Evaluation (Signed)
Anesthesia Post Note  Patient: Colin Powell  Procedure(s) Performed: LAPAROSCOPIC LEFT AND RIGHT INGUINAL HERNIA REPAIR WITH MESH, TAP BLOCK (N/A ) OPEN HERNIA REPAIR UMBILICAL ADULT (Abdomen)     Patient location during evaluation: PACU Anesthesia Type: General Level of consciousness: awake and alert Pain management: pain level controlled Vital Signs Assessment: post-procedure vital signs reviewed and stable Respiratory status: spontaneous breathing, nonlabored ventilation and respiratory function stable Cardiovascular status: blood pressure returned to baseline and stable Postop Assessment: no apparent nausea or vomiting Anesthetic complications: no   No complications documented.  Last Vitals:  Vitals:   09/24/20 1315 09/24/20 1330  BP: (!) 151/93 138/83  Pulse: 89 83  Resp: 19 12  Temp:    SpO2: 98% 96%    Last Pain:  Vitals:   09/24/20 1330  TempSrc:   PainSc: 0-No pain                 Audry Pili

## 2020-09-25 ENCOUNTER — Encounter (HOSPITAL_BASED_OUTPATIENT_CLINIC_OR_DEPARTMENT_OTHER): Payer: Self-pay | Admitting: Surgery
